# Patient Record
Sex: Female | Born: 1985 | Race: White | Hispanic: No | Marital: Single
Health system: Midwestern US, Community
[De-identification: ages and names within clinical notes are randomized; demographics above are authoritative.]

## PROBLEM LIST (undated history)

## (undated) DIAGNOSIS — K219 Gastro-esophageal reflux disease without esophagitis: Secondary | ICD-10-CM

## (undated) DIAGNOSIS — R87629 Unspecified abnormal cytological findings in specimens from vagina: Secondary | ICD-10-CM

## (undated) DIAGNOSIS — F909 Attention-deficit hyperactivity disorder, unspecified type: Secondary | ICD-10-CM

## (undated) HISTORY — PX: CHOLECYSTECTOMY: SHX55

## (undated) HISTORY — DX: Unspecified abnormal cytological findings in specimens from vagina: R87.629

## (undated) HISTORY — PX: GALLBLADDER SURGERY: SHX652

---
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
## 2010-09-25 ENCOUNTER — Other Ambulatory Visit: Payer: Self-pay | Admitting: Family Medicine

## 2010-09-25 DIAGNOSIS — M545 Low back pain, unspecified: Secondary | ICD-10-CM

## 2010-09-30 ENCOUNTER — Ambulatory Visit
Admission: RE | Admit: 2010-09-30 | Discharge: 2010-09-30 | Disposition: A | Discharge status: Home / Self Care | Payer: 59 | Source: Ambulatory Visit | Attending: Family Medicine | Admitting: Family Medicine

## 2010-09-30 DIAGNOSIS — M545 Low back pain, unspecified: Secondary | ICD-10-CM

## 2012-01-08 ENCOUNTER — Emergency Department (HOSPITAL_COMMUNITY): Payer: 59

## 2012-01-08 ENCOUNTER — Emergency Department (HOSPITAL_COMMUNITY)
Admission: EM | Admit: 2012-01-08 | Discharge: 2012-01-08 | Disposition: A | Discharge status: Home / Self Care | Payer: 59 | Attending: Emergency Medicine | Admitting: Emergency Medicine

## 2012-01-08 ENCOUNTER — Encounter (HOSPITAL_COMMUNITY): Payer: Self-pay | Admitting: *Deleted

## 2012-01-08 DIAGNOSIS — Z8719 Personal history of other diseases of the digestive system: Secondary | ICD-10-CM | POA: Insufficient documentation

## 2012-01-08 DIAGNOSIS — Z7982 Long term (current) use of aspirin: Secondary | ICD-10-CM | POA: Insufficient documentation

## 2012-01-08 DIAGNOSIS — F17200 Nicotine dependence, unspecified, uncomplicated: Secondary | ICD-10-CM | POA: Insufficient documentation

## 2012-01-08 DIAGNOSIS — R0789 Other chest pain: Secondary | ICD-10-CM | POA: Insufficient documentation

## 2012-01-08 DIAGNOSIS — R079 Chest pain, unspecified: Secondary | ICD-10-CM

## 2012-01-08 DIAGNOSIS — R0602 Shortness of breath: Secondary | ICD-10-CM | POA: Insufficient documentation

## 2012-01-08 HISTORY — DX: Gastro-esophageal reflux disease without esophagitis: K21.9

## 2012-01-08 LAB — POCT PREGNANCY, URINE: Preg Test, Ur: NEGATIVE

## 2012-01-08 MED ORDER — ALPRAZOLAM 0.25 MG PO TABS: 0.2500 mg | ORAL_TABLET | Freq: Three times a day (TID) | ORAL | Status: DC | PRN

## 2012-01-08 NOTE — ED Notes (Signed)
Per ems report, pt was awaken about an hour ago with left chest wall pain, now across epigastric area, under bilateral breast, and chest. Pain was 10/10, 12 lead unremarkable.

## 2012-01-08 NOTE — ED Notes (Signed)
[id number]<AV> Expected date:<BR> Expected time:<BR> Means of arrival:<BR> Comments:<BR> EMS

## 2012-01-08 NOTE — ED Provider Notes (Signed)
History     CSN: [id number]  Arrival date & time 01/08/12  2109   First MD Initiated Contact with Patient 01/08/12 2151      Chief Complaint  Patient presents with  . Chest Pain    (Consider location/radiation/quality/duration/timing/severity/associated sxs/prior treatment) HPI Comments: Erica Stafford awoke reporting severe central and left sided chest pain.  She states it felt vice-like.  She states she also had upper abdominal discomfort.  She denies any active pain. She denies fever, cough, SOB, back pain, recent travel, leg pain/swelling, or hormone/OCP usage.  Patient is a 26 y.o. female presenting with chest pain. The history is provided by the patient. No language interpreter was used.  Chest Pain The chest pain began 1 - 2 hours ago. Duration of episode(s) is 1 hour. Chest pain occurs constantly. The chest pain is resolved. At its most intense, the pain is at 10/10. The pain is currently at 0/10. The severity of the pain is severe. The quality of the pain is described as sharp, pressure-like and tightness. The pain does not radiate. Primary symptoms include shortness of breath. Pertinent negatives for primary symptoms include no fever, no fatigue, no syncope, no cough, no wheezing, no palpitations, no abdominal pain, no vomiting, no dizziness and no altered mental status.  Pertinent negatives for associated symptoms include no claudication, no diaphoresis, no lower extremity edema, no near-syncope, no numbness, no orthopnea, no paroxysmal nocturnal dyspnea and no weakness. She tried nothing for the symptoms. Risk factors include smoking/tobacco exposure.  Her family medical history is significant for heart disease in family (none in folks under the age of 79).     Past Medical History  Diagnosis Date  . GERD (gastroesophageal reflux disease)     History reviewed. No pertinent past surgical history.  No family history on file.  History  Substance Use Topics  . Smoking status:  Current Every Day Smoker  . Smokeless tobacco: Not on file  . Alcohol Use: Yes    OB History    Grav Para Term Preterm Abortions TAB SAB Ect Mult Living                  Review of Systems  Constitutional: Negative for fever, diaphoresis and fatigue.  Respiratory: Positive for shortness of breath. Negative for cough and wheezing.   Cardiovascular: Positive for chest pain. Negative for palpitations, orthopnea, claudication, syncope and near-syncope.  Gastrointestinal: Negative for vomiting and abdominal pain.  Neurological: Negative for dizziness, weakness and numbness.  Psychiatric/Behavioral: Negative for altered mental status.  All other systems reviewed and are negative.    Allergies  Review of patient's allergies indicates no known allergies.  Home Medications   Current Outpatient Rx  Name  Route  Sig  Dispense  Refill  . ASPIRIN 325 MG PO TABS   Oral   Take 325 mg by mouth once.           BP 115/58  Pulse 90  Temp 97.9 F (36.6 C)  Resp 18  SpO2 98%  LMP 01/06/2012  Physical Exam  Nursing note and vitals reviewed. Constitutional: She is oriented to person, place, and time. She appears well-developed and well-nourished. No distress.  HENT:  Head: Normocephalic and atraumatic.  Right Ear: External ear normal.  Left Ear: External ear normal.  Nose: Nose normal.  Mouth/Throat: Oropharynx is clear and moist. No oropharyngeal exudate.  Eyes: Conjunctivae normal are normal. Pupils are equal, round, and reactive to light. Right eye exhibits no discharge. Left  eye exhibits no discharge. No scleral icterus.  Neck: Normal range of motion. Neck supple. No JVD present. No tracheal deviation present.  Cardiovascular: Normal rate, regular rhythm, normal heart sounds and intact distal pulses.  Exam reveals no gallop and no friction rub.   No murmur heard. Pulmonary/Chest: Effort normal and breath sounds normal. No stridor. No respiratory distress. She has no wheezes. She  has no rales. She exhibits no tenderness.  Abdominal: Soft. Bowel sounds are normal. She exhibits no distension and no mass. There is no tenderness. There is no rebound and no guarding.  Musculoskeletal: Normal range of motion. She exhibits no edema and no tenderness.  Lymphadenopathy:    She has no cervical adenopathy.  Neurological: She is alert and oriented to person, place, and time.  Skin: Skin is warm. No rash noted. She is not diaphoretic. No erythema. No pallor.  Psychiatric: She has a normal mood and affect. Her behavior is normal.    ED Course  Procedures (including critical care time)  Labs Reviewed - No data to display Dg Chest 2 View  01/08/2012  *RADIOLOGY REPORT*  Clinical Data: Left anterior chest pain, radiating to the back. History of smoking.  CHEST - 2 VIEW  Comparison: Chest radiograph performed 02/19/2010  Findings: The lungs are relatively well-aerated.  Mildly increased interstitial markings are likely chronic in nature.  There is no evidence of focal opacification, pleural effusion or pneumothorax.  The heart is normal in size; the mediastinal contour is within normal limits.  No acute osseous abnormalities are seen.  IMPRESSION: No acute cardiopulmonary process seen; mildly increased interstitial markings are likely chronic in nature.   Original Report Authenticated By: Tonia Ghent, M.D.      No diagnosis found.   Date: 01/08/2012  Rate: 73 bpm  Rhythm: normal sinus rhythm  QRS Axis: normal  Intervals: normal  ST/T Wave abnormalities: normal  Conduction Disutrbances:none  Narrative Interpretation:   Old EKG Reviewed: none available      MDM  Pt presents for evaluation of chest pain.  She appears nontoxic, note stable VS, NAD.  Pt  Is currently pain free.  She has no respiratory insufficiency, tachycardia, tachypnea, hypoxemia, or hypotension.  Her EKG demonstrates no evidence of strain or acute ischemia.  She is a smoker but denies other risk factors  for early CAD and is low risk for PE by Well's Criteria.  The CXR does not demonstrate cardiomegaly, a ptx, effusion, or infiltrate (note nl mediastinum).  She has been evaluated for CP previously by a PMD and referred for an echocardiogram.  It was thought at the time that she had some issues with anxiety/panic disorder.  She has also been evaluated by a different provider and prescribed a ppi secondary to reflux.  She denies s/s of reflux now.  Pt appears stable, plan discharge home.  Will refer to a local cardiologist for completion of an evaluation for recurrent chest discomfort.  Will also prescribe a trial of xanax.        Tobin Chad, MD 01/08/12 2219

## 2014-12-18 NOTE — ED Provider Notes (Signed)
PATIENT:          Denise Acosta, Denise Acosta          DOS:           12/18/2014  MR #:             [id number]             ACCOUNT #:     [id number]  DATE OF BIRTH:    12-Sep-1985              AGE:           29      HISTORY OF PRESENT ILLNESS:    PERTINENT HISTORY OF PRESENT  ILLNESS. Patient presents today with  burning in  her stomach when eating since yesterday.  The burning has become  constant  today.  She has had reflux before but it is never lasted this long.  She denies  any vomiting but does feel slightly nauseated.  She states that her  reflux seem  to be getting much better over the past one year since she started  exercising,  losing weight and stop smoking.  This is the first episode of reflux  like  symptoms that she has had since then.  She has no history of prior  abdominal  surgeries.        PHYSICAL EXAM Afebrile, hemodynamically stable.  Moist mucosal  membranes.  Neck supple no JVD.  Heart regular,no murmur.  Lungs clear.  Abdomen  soft and  nontender, no rebound or guarding.  Extremities without edema, intact  distal  pulses all extremities.  Alert and oriented x3.  Neuro exam without  lateralizing signs.    MEDICAL DECISION MAKING:    SIGNIFICANT FINDINGS/ED COURSE/MEDICAL DECISION MAKING/TREATMENT  PLAN At this  point time the patient had no reproducible abdominal tenderness on  examination.   I did give her a GI cocktail and Pepcid.  This did not significantly  help  therefore I perform labs including lipase and hepatic panel.  Patient  did have  slight elevations of her frailty and AST.  She denies any alcohol or  acetaminophen usage.  No prior history of hepatitis.  Again she  remains  nontender on serial abdominal examinations and now the medicine does  seem to be  helping somewhat.  She has no obvious signs of cholecystitis  clinically.  Ultrasound services are currently not available.  She is comfortable  going home  and I will put her on Pepcid but she understands to return should  she  develop  worsening pain fevers or vomiting.    PROBLEM LIST:         ED Diagnosis:     Acute epigastric pain (R10.13): Entered Date: 18-Dec-2014 19:43,  Entered By:  Jackquline BerlinNDREJKA, Lyndsie Wallman, Status: Active, ICD-10: R10.13       Admit Reason:     Nausea: Entered Date: 18-Dec-2014 17:57, Entered By: Standley BrookingINTERFACES,  INTERFACES,  Status: Active    Electronic Signatures:  Jackquline BerlinNDREJKA, [place] Searles (DO)  (Signed 18-Dec-2014 19:43)   Authored: HISTORY OF PRESENT ILLNESS, PHYSICAL EXAM, MEDICAL DECISION  MAKING,  PROBLEM LIST      Last Updated: 18-Dec-2014 19:43 by Jackquline BerlinNDREJKA, Servando Kyllonen (DO)            Please see T-Sheet, initial assessment, and physician orders for  further details.    Dictating Physician: Leona SingletonJason E Silvie Obremski, DO  Original Electronic Signature Date: 12/18/2014 06:20 P  JEO  Document #: [id number]    cc:  Coralie Common, MD       [place]       [place] [id number]

## 2014-12-19 LAB — HEPATIC FUNCTION PANEL
ALT: 123 U/L — ABNORMAL HIGH (ref 12–78)
AST: 172 U/L — ABNORMAL HIGH (ref 15–37)
Albumin,Serum: 3.8 g/dL (ref 3.4–5.0)
Alkaline Phosphatase: 70 U/L (ref 45–117)
Bilirubin, Direct: 0.2 mg/dL (ref 0.0–0.2)
Total Bilirubin: 0.5 mg/dL (ref 0.2–1.0)
Total Protein: 7.4 g/dL (ref 6.4–8.2)

## 2014-12-19 LAB — LIPASE: Lipase: 108 IU/L (ref 73–393)

## 2015-03-12 DIAGNOSIS — L309 Dermatitis, unspecified: Secondary | ICD-10-CM | POA: Insufficient documentation

## 2015-10-17 DIAGNOSIS — M7582 Other shoulder lesions, left shoulder: Secondary | ICD-10-CM | POA: Insufficient documentation

## 2015-11-06 ENCOUNTER — Other Ambulatory Visit: Payer: Self-pay | Admitting: Surgery

## 2015-11-06 DIAGNOSIS — S46912D Strain of unspecified muscle, fascia and tendon at shoulder and upper arm level, left arm, subsequent encounter: Secondary | ICD-10-CM

## 2015-11-06 DIAGNOSIS — M7582 Other shoulder lesions, left shoulder: Secondary | ICD-10-CM

## 2017-07-08 ENCOUNTER — Ambulatory Visit
Admission: RE | Admit: 2017-07-08 | Discharge: 2017-07-08 | Disposition: A | Discharge status: Home / Self Care | Payer: No Typology Code available for payment source | Source: Ambulatory Visit | Attending: Nurse Practitioner | Admitting: Nurse Practitioner

## 2017-07-08 ENCOUNTER — Other Ambulatory Visit: Payer: Self-pay | Admitting: Nurse Practitioner

## 2017-07-08 ENCOUNTER — Ambulatory Visit
Admission: RE | Admit: 2017-07-08 | Discharge: 2017-07-08 | Disposition: A | Discharge status: Home / Self Care | Payer: Self-pay | Source: Ambulatory Visit | Attending: Nurse Practitioner | Admitting: Nurse Practitioner

## 2017-07-08 DIAGNOSIS — T148XXA Other injury of unspecified body region, initial encounter: Secondary | ICD-10-CM

## 2019-01-17 ENCOUNTER — Ambulatory Visit (INDEPENDENT_AMBULATORY_CARE_PROVIDER_SITE_OTHER): Payer: 59 | Admitting: Advanced Practice Midwife

## 2019-01-17 ENCOUNTER — Encounter: Payer: Self-pay | Admitting: Advanced Practice Midwife

## 2019-01-17 ENCOUNTER — Other Ambulatory Visit: Payer: Self-pay

## 2019-01-17 VITALS — BP 134/73 | HR 58 | Ht 62.0 in | Wt 165.0 lb

## 2019-01-17 DIAGNOSIS — Z0000 Encounter for general adult medical examination without abnormal findings: Secondary | ICD-10-CM | POA: Insufficient documentation

## 2019-01-17 DIAGNOSIS — Z01419 Encounter for gynecological examination (general) (routine) without abnormal findings: Secondary | ICD-10-CM | POA: Diagnosis not present

## 2019-01-17 DIAGNOSIS — Z124 Encounter for screening for malignant neoplasm of cervix: Secondary | ICD-10-CM

## 2019-01-17 DIAGNOSIS — Z1151 Encounter for screening for human papillomavirus (HPV): Secondary | ICD-10-CM

## 2019-01-17 DIAGNOSIS — Z87891 Personal history of nicotine dependence: Secondary | ICD-10-CM | POA: Diagnosis not present

## 2019-01-17 NOTE — Patient Instructions (Signed)
Preventive Care 21-32 Years Old, Female Preventive care refers to visits with your health care provider and lifestyle choices that can promote health and wellness. This includes:  A yearly physical exam. This may also be called an annual well check.  Regular dental visits and eye exams.  Immunizations.  Screening for certain conditions.  Healthy lifestyle choices, such as eating a healthy diet, getting regular exercise, not using drugs or products that contain nicotine and tobacco, and limiting alcohol use. What can I expect for my preventive care visit? Physical exam Your health care provider will check your:  Height and weight. This may be used to calculate body mass index (BMI), which tells if you are at a healthy weight.  Heart rate and blood pressure.  Skin for abnormal spots. Counseling Your health care provider may ask you questions about your:  Alcohol, tobacco, and drug use.  Emotional well-being.  Home and relationship well-being.  Sexual activity.  Eating habits.  Work and work environment.  Method of birth control.  Menstrual cycle.  Pregnancy history. What immunizations do I need?  Influenza (flu) vaccine  This is recommended every year. Tetanus, diphtheria, and pertussis (Tdap) vaccine  You may need a Td booster every 10 years. Varicella (chickenpox) vaccine  You may need this if you have not been vaccinated. Human papillomavirus (HPV) vaccine  If recommended by your health care provider, you may need three doses over 6 months. Measles, mumps, and rubella (MMR) vaccine  You may need at least one dose of MMR. You may also need a second dose. Meningococcal conjugate (MenACWY) vaccine  One dose is recommended if you are age 19-21 years and a first-year college student living in a residence hall, or if you have one of several medical conditions. You may also need additional booster doses. Pneumococcal conjugate (PCV13) vaccine  You may need  this if you have certain conditions and were not previously vaccinated. Pneumococcal polysaccharide (PPSV23) vaccine  You may need one or two doses if you smoke cigarettes or if you have certain conditions. Hepatitis A vaccine  You may need this if you have certain conditions or if you travel or work in places where you may be exposed to hepatitis A. Hepatitis B vaccine  You may need this if you have certain conditions or if you travel or work in places where you may be exposed to hepatitis B. Haemophilus influenzae type b (Hib) vaccine  You may need this if you have certain conditions. You may receive vaccines as individual doses or as more than one vaccine together in one shot (combination vaccines). Talk with your health care provider about the risks and benefits of combination vaccines. What tests do I need?  Blood tests  Lipid and cholesterol levels. These may be checked every 5 years starting at age 20.  Hepatitis C test.  Hepatitis B test. Screening  Diabetes screening. This is done by checking your blood sugar (glucose) after you have not eaten for a while (fasting).  Sexually transmitted disease (STD) testing.  BRCA-related cancer screening. This may be done if you have a family history of breast, ovarian, tubal, or peritoneal cancers.  Pelvic exam and Pap test. This may be done every 3 years starting at age 21. Starting at age 30, this may be done every 5 years if you have a Pap test in combination with an HPV test. Talk with your health care provider about your test results, treatment options, and if necessary, the need for more tests.   Follow these instructions at home: Eating and drinking   Eat a diet that includes fresh fruits and vegetables, whole grains, lean protein, and low-fat dairy.  Take vitamin and mineral supplements as recommended by your health care provider.  Do not drink alcohol if: ? Your health care provider tells you not to drink. ? You are  pregnant, may be pregnant, or are planning to become pregnant.  If you drink alcohol: ? Limit how much you have to 0-1 drink a day. ? Be aware of how much alcohol is in your drink. In the [place] one drink equals one 12 oz bottle of beer (355 mL), one 5 oz glass of wine (148 mL), or one 1 oz glass of hard liquor (44 mL). Lifestyle  Take daily care of your teeth and gums.  Stay active. Exercise for at least 30 minutes on 5 or more days each week.  Do not use any products that contain nicotine or tobacco, such as cigarettes, e-cigarettes, and chewing tobacco. If you need help quitting, ask your health care provider.  If you are sexually active, practice safe sex. Use a condom or other form of birth control (contraception) in order to prevent pregnancy and STIs (sexually transmitted infections). If you plan to become pregnant, see your health care provider for a preconception visit. What's next?  Visit your health care provider once a year for a well check visit.  Ask your health care provider how often you should have your eyes and teeth checked.  Stay up to date on all vaccines. This information is not intended to replace advice given to you by your health care provider. Make sure you discuss any questions you have with your health care provider. Document Revised: 09/08/2017 Document Reviewed: 09/08/2017 Elsevier Patient Education  2020 [employer].

## 2019-01-17 NOTE — Progress Notes (Signed)
GYNECOLOGY ANNUAL PREVENTATIVE CARE ENCOUNTER NOTE  History:     Erica Stafford is a 34 y.o. G0P0000 female here for a routine annual gynecologic exam.  Current complaints: none.   Denies abnormal vaginal bleeding, discharge, pelvic pain, problems with intercourse or other gynecologic concerns.    Patient works as a IT sales professional. Exercises up to 3 times per day. Female partner, penetrative sex. Former cigarette smoker, quit four years ago. Denies SI, HI, IPV.   Gynecologic History Patient's last menstrual period was 01/02/2019 (approximate). Contraception: none Last Pap: No prior paps.  Last mammogram: N/A.   Obstetric History OB History  Gravida Para Term Preterm AB Living  0 0 0 0 0 0  SAB TAB Ectopic Multiple Live Births  0 0 0 0 0    Past Medical History:  Diagnosis Date  . GERD (gastroesophageal reflux disease)     Past Surgical History:  Procedure Laterality Date  . GALLBLADDER SURGERY      Current Outpatient Medications on File Prior to Visit  Medication Sig Dispense Refill  . amphetamine-dextroamphetamine (ADDERALL) 5 MG tablet Take 5 mg by mouth daily.    Marland Kitchen ALPRAZolam (XANAX) 0.25 MG tablet Take 1 tablet (0.25 mg total) by mouth 3 (three) times daily as needed for anxiety. (Patient not taking: Reported on 01/17/2019) 15 tablet 0  . aspirin 325 MG tablet Take 325 mg by mouth once.     No current facility-administered medications on file prior to visit.    No Known Allergies  Social History:  reports that she has quit smoking. She has never used smokeless tobacco. She reports current alcohol use. She reports that she does not use drugs.  Family History  Problem Relation Age of Onset  . Breast cancer Maternal Grandmother     The following portions of the patient's history were reviewed and updated as appropriate: allergies, current medications, past family history, past medical history, past social history, past surgical history and problem list.  Review  of Systems Pertinent items noted in HPI and remainder of comprehensive ROS otherwise negative.  Physical Exam:  BP 134/73   Pulse (!) 58   Ht 5\' 2"  (1.575 m)   Wt 165 lb (74.8 kg)   LMP 01/02/2019 (Approximate)   BMI 30.18 kg/m  CONSTITUTIONAL: Well-developed, well-nourished female in no acute distress.  HENT:  Normocephalic, atraumatic, External right and left ear normal. Oropharynx is clear and moist EYES: Conjunctivae and EOM are normal. Pupils are equal, round, and reactive to light. No scleral icterus.  NECK: Normal range of motion, supple, no masses.  Normal thyroid.  SKIN: Skin is warm and dry. No rash noted. Not diaphoretic. No erythema. No pallor. MUSCULOSKELETAL: Normal range of motion. No tenderness.  No cyanosis, clubbing, or edema.  2+ distal pulses. NEUROLOGIC: Alert and oriented to person, place, and time. Normal reflexes, muscle tone coordination.  PSYCHIATRIC: Normal mood and affect. Normal behavior. Normal judgment and thought content. CARDIOVASCULAR: Normal heart rate noted, regular rhythm RESPIRATORY: Clear to auscultation bilaterally. Effort and breath sounds normal, no problems with respiration noted. BREASTS: Symmetric in size. No masses, tenderness, skin changes, nipple drainage, or lymphadenopathy bilaterally. ABDOMEN: Soft, no distention noted.  No tenderness, rebound or guarding.  PELVIC: Normal appearing external genitalia and urethral meatus; normal appearing vaginal mucosa and cervix.  No abnormal discharge noted.  Pap smear obtained.  Normal uterine size, no other palpable masses, no uterine or adnexal tenderness.   Assessment and Plan:    1. Well  woman exam with routine gynecological exam - No concerning findings on physical exam - Additional labs requested by PCP - PAP over 30 - TSH - Vitamin D (25 hydroxy) - CMP - Lipid panel - Hemoglobin A1c - Hepatitis B surface antigen - Hepatitis C antibody - HIV antibody - RPR - B12 and Folate Panel - CBC  w/Diff  2. Former smoker - Education officer, environmental!  Will follow up results of pap smear and manage accordingly. Routine preventative health maintenance measures emphasized. Please refer to After Visit Summary for other counseling recommendations.      Total visit time 30 minutes. Greater than 50% of visit spent in counseling and coordination of care  Mallie Snooks, MSN, CNM Certified Nurse Midwife, [employer] for [employer], [facility] 01/17/19 1:15 PM

## 2019-01-18 LAB — COMPREHENSIVE METABOLIC PANEL
ALT: 10 IU/L (ref 0–32)
AST: 17 IU/L (ref 0–40)
Albumin/Globulin Ratio: 2 (ref 1.2–2.2)
Albumin: 4.4 g/dL (ref 3.8–4.8)
Alkaline Phosphatase: 26 IU/L — ABNORMAL LOW (ref 39–117)
BUN/Creatinine Ratio: 19 (ref 9–23)
BUN: 14 mg/dL (ref 6–20)
Bilirubin Total: 0.4 mg/dL (ref 0.0–1.2)
CO2: 23 mmol/L (ref 20–29)
Calcium: 9.5 mg/dL (ref 8.7–10.2)
Chloride: 103 mmol/L (ref 96–106)
Creatinine, Ser: 0.75 mg/dL (ref 0.57–1.00)
GFR calc Af Amer: 123 mL/min/{1.73_m2} (ref >59)
GFR calc non Af Amer: 107 mL/min/{1.73_m2} (ref >59)
Globulin, Total: 2.2 g/dL (ref 1.5–4.5)
Glucose: 85 mg/dL (ref 65–99)
Potassium: 4 mmol/L (ref 3.5–5.2)
Sodium: 138 mmol/L (ref 134–144)
Total Protein: 6.6 g/dL (ref 6.0–8.5)

## 2019-01-18 LAB — B12 AND FOLATE PANEL
Folate: 6 ng/mL (ref >3.0)
Vitamin B-12: 490 pg/mL (ref 232–1245)

## 2019-01-18 LAB — CBC WITH DIFFERENTIAL/PLATELET
Basophils Absolute: 0.1 10*3/uL (ref 0.0–0.2)
Basos: 1 %
EOS (ABSOLUTE): 0 10*3/uL (ref 0.0–0.4)
Eos: 1 %
Hematocrit: 39.9 % (ref 34.0–46.6)
Hemoglobin: 13.9 g/dL (ref 11.1–15.9)
Immature Grans (Abs): 0 10*3/uL (ref 0.0–0.1)
Immature Granulocytes: 0 %
Lymphocytes Absolute: 2 10*3/uL (ref 0.7–3.1)
Lymphs: 39 %
MCH: 32.8 pg (ref 26.6–33.0)
MCHC: 34.8 g/dL (ref 31.5–35.7)
MCV: 94 fL (ref 79–97)
Monocytes Absolute: 0.4 10*3/uL (ref 0.1–0.9)
Monocytes: 9 %
Neutrophils Absolute: 2.5 10*3/uL (ref 1.4–7.0)
Neutrophils: 50 %
Platelets: 240 10*3/uL (ref 150–450)
RBC: 4.24 x10E6/uL (ref 3.77–5.28)
RDW: 12 % (ref 11.7–15.4)
WBC: 5 10*3/uL (ref 3.4–10.8)

## 2019-01-18 LAB — LIPID PANEL
Chol/HDL Ratio: 2.9 ratio (ref 0.0–4.4)
Cholesterol, Total: 140 mg/dL (ref 100–199)
HDL: 48 mg/dL (ref >39)
LDL Chol Calc (NIH): 78 mg/dL (ref 0–99)
Triglycerides: 67 mg/dL (ref 0–149)
VLDL Cholesterol Cal: 14 mg/dL (ref 5–40)

## 2019-01-18 LAB — HEPATITIS B SURFACE ANTIGEN: Hepatitis B Surface Ag: NEGATIVE

## 2019-01-18 LAB — VITAMIN D 25 HYDROXY (VIT D DEFICIENCY, FRACTURES): Vit D, 25-Hydroxy: 33.2 ng/mL (ref 30.0–100.0)

## 2019-01-18 LAB — HEMOGLOBIN A1C
Est. average glucose Bld gHb Est-mCnc: 91 mg/dL
Hgb A1c MFr Bld: 4.8 % (ref 4.8–5.6)

## 2019-01-18 LAB — HIV ANTIBODY (ROUTINE TESTING W REFLEX): HIV Screen 4th Generation wRfx: NONREACTIVE

## 2019-01-18 LAB — HEPATITIS C ANTIBODY: Hep C Virus Ab: <0.1 s/co ratio (ref 0.0–0.9)

## 2019-01-18 LAB — TSH: TSH: 2.1 u[IU]/mL (ref 0.450–4.500)

## 2019-01-18 LAB — RPR: RPR Ser Ql: NONREACTIVE

## 2019-01-22 ENCOUNTER — Encounter: Payer: Self-pay | Admitting: Advanced Practice Midwife

## 2019-01-22 ENCOUNTER — Telehealth: Payer: Self-pay | Admitting: Advanced Practice Midwife

## 2019-01-22 DIAGNOSIS — R87619 Unspecified abnormal cytological findings in specimens from cervix uteri: Secondary | ICD-10-CM | POA: Insufficient documentation

## 2019-01-22 LAB — CYTOLOGY - PAP
Comment: NEGATIVE
Comment: NEGATIVE
Comment: NEGATIVE
Diagnosis: UNDETERMINED — AB
HPV 16: NEGATIVE
HPV 18 / 45: NEGATIVE
High risk HPV: POSITIVE — AB

## 2019-01-22 NOTE — Telephone Encounter (Signed)
Patient called to discuss pap results, indication for colpo. Discussed basics of procedure, timeline for performing, confirmed will be performed wt [facility] . Patient denies questions at end of call. Knows she can reach out to me via MyChart for additional questions. Clayton Bibles, MSN, CNM Certified Nurse Midwife, [facility] for [employer], [facility] 01/22/19 4:37 PM

## 2019-02-07 ENCOUNTER — Encounter: Payer: 59 | Admitting: Family Medicine

## 2019-02-11 NOTE — Telephone Encounter (Signed)
Entered in error, duplicate of phone encounter from 01/22/2019

## 2019-02-16 ENCOUNTER — Encounter: Payer: Self-pay | Admitting: Radiology

## 2019-11-21 ENCOUNTER — Encounter: Payer: Self-pay | Admitting: Radiology

## 2020-01-07 ENCOUNTER — Other Ambulatory Visit: Payer: Self-pay

## 2020-01-07 ENCOUNTER — Ambulatory Visit
Admission: EM | Admit: 2020-01-07 | Discharge: 2020-01-07 | Disposition: A | Discharge status: Home / Self Care | Payer: 59 | Attending: Family Medicine | Admitting: Family Medicine

## 2020-01-07 DIAGNOSIS — B349 Viral infection, unspecified: Secondary | ICD-10-CM | POA: Insufficient documentation

## 2020-01-07 HISTORY — DX: Attention-deficit hyperactivity disorder, unspecified type: F90.9

## 2020-01-07 LAB — POCT RAPID STREP A (OFFICE): Rapid Strep A Screen: NEGATIVE

## 2020-01-07 NOTE — ED Provider Notes (Signed)
Renaldo Fiddler    CSN: [id number] Arrival date & time: 01/07/20  8563      History   Chief Complaint Chief Complaint  Patient presents with  . Sore Throat    HPI Erica Stafford is a 34 y.o. female.   Patient is a 34 year old female who presents today with approximate 3 days of sore throat.  99.4 fever at home.  Also has cough.  Took Tylenol this morning.  Has been COVID vaccinated.     Past Medical History:  Diagnosis Date  . ADHD   . GERD (gastroesophageal reflux disease)     Patient Active Problem List   Diagnosis Date Noted  . Abnormal Pap smear of cervix 01/22/2019  . Well woman exam with routine gynecological exam 01/17/2019    Past Surgical History:  Procedure Laterality Date  . CHOLECYSTECTOMY    . GALLBLADDER SURGERY      OB History    Gravida  0   Para  0   Term  0   Preterm  0   AB  0   Living  0     SAB  0   IAB  0   Ectopic  0   Multiple  0   Live Births  0            Home Medications    Prior to Admission medications   Medication Sig Start Date End Date Taking? Authorizing Provider  amphetamine-dextroamphetamine (ADDERALL) 5 MG tablet Take 5 mg by mouth daily.   Yes [provider]  aspirin 325 MG tablet Take 325 mg by mouth once.   Yes [provider]    Family History Family History  Problem Relation Age of Onset  . Breast cancer Maternal Grandmother     Social History Social History   Tobacco Use  . Smoking status: Former Games developer  . Smokeless tobacco: Never Used  Vaping Use  . Vaping Use: Never used  Substance Use Topics  . Alcohol use: Yes  . Drug use: No     Allergies   Patient has no known allergies.   Review of Systems Review of Systems   Physical Exam Triage Vital Signs ED Triage Vitals  Enc Vitals Group     BP 01/07/20 0828 121/73     Pulse Rate 01/07/20 0828 82     Resp 01/07/20 0828 16     Temp 01/07/20 0828 98.5 F (36.9 C)     Temp Source 01/07/20  0828 Oral     SpO2 01/07/20 0828 98 %     Weight --      Height --      Head Circumference --      Peak Flow --      Pain Score 01/07/20 0830 4     Pain Loc --      Pain Edu? --      Excl. in GC? --    No data found.  Updated Vital Signs BP 121/73 (BP Location: Left Arm)   Pulse 82   Temp 98.5 F (36.9 C) (Oral)   Resp 16   LMP 12/12/2019   SpO2 98%   Visual Acuity Right Eye Distance:   Left Eye Distance:   Bilateral Distance:    Right Eye Near:   Left Eye Near:    Bilateral Near:     Physical Exam Constitutional:      General: She is not in acute distress.    Appearance: She is  not ill-appearing, toxic-appearing or diaphoretic.  HENT:     Head: Normocephalic.     Right Ear: Tympanic membrane and ear canal normal.     Left Ear: Tympanic membrane and ear canal normal.     Mouth/Throat:     Pharynx: Uvula midline. Posterior oropharyngeal erythema present.     Tonsils: 0 on the right. 0 on the left.  Eyes:     Conjunctiva/sclera: Conjunctivae normal.  Cardiovascular:     Heart sounds: Normal heart sounds.  Pulmonary:     Effort: Pulmonary effort is normal.     Breath sounds: Normal breath sounds.  Lymphadenopathy:     Cervical: Cervical adenopathy present.  Skin:    General: Skin is warm and dry.  Neurological:     Mental Status: She is alert.      UC Treatments / Results  Labs (all labs ordered are listed, but only abnormal results are displayed) Labs Reviewed  NOVEL CORONAVIRUS, NAA  POCT RAPID STREP A (OFFICE)    EKG   Radiology No results found.  Procedures Procedures (including critical care time)  Medications Ordered in UC Medications - No data to display  Initial Impression / Assessment and Plan / UC Course  I have reviewed the triage vital signs and the nursing notes.  Pertinent labs & imaging results that were available during my care of the patient were reviewed by me and considered in my medical decision making (see chart for  details).     Viral illness Strep test negative. Culture pending.  OTC meds as needed.  covid test pending.  Follow up as needed for continued or worsening symptoms  Final Clinical Impressions(s) / UC Diagnoses   Final diagnoses:  Viral illness     Discharge Instructions     Viral illness. Strep negative. We will send the strep for culture. You may use OTC chloraseptic spray or throat lozenges for relief. You can also try some zyrtec for your symptoms because this may ne allergy related. Follow up if not better or worsening symptoms      ED Prescriptions    None     PDMP not reviewed this encounter.   Janace Aris, NP 01/07/20 [phone]

## 2020-01-07 NOTE — Discharge Instructions (Signed)
Viral illness. Strep negative. We will send the strep for culture. You may use OTC chloraseptic spray or throat lozenges for relief. You can also try some zyrtec for your symptoms because this may ne allergy related. Follow up if not better or worsening symptoms

## 2020-01-07 NOTE — ED Triage Notes (Signed)
Pt presents with sore throat x 3 days.  Temp 99.4 at home.  + cough.  Took Tylenol 0330 this morning.   Has had COVID x 2.

## 2020-01-09 LAB — NOVEL CORONAVIRUS, NAA: SARS-CoV-2, NAA: DETECTED — AB

## 2020-01-09 LAB — SARS-COV-2, NAA 2 DAY TAT

## 2020-01-10 LAB — CULTURE, GROUP A STREP (THRC)

## 2020-03-14 DIAGNOSIS — M9904 Segmental and somatic dysfunction of sacral region: Secondary | ICD-10-CM | POA: Diagnosis not present

## 2020-03-14 DIAGNOSIS — M9903 Segmental and somatic dysfunction of lumbar region: Secondary | ICD-10-CM | POA: Diagnosis not present

## 2020-03-14 DIAGNOSIS — M9902 Segmental and somatic dysfunction of thoracic region: Secondary | ICD-10-CM | POA: Diagnosis not present

## 2020-03-14 DIAGNOSIS — M5386 Other specified dorsopathies, lumbar region: Secondary | ICD-10-CM | POA: Diagnosis not present

## 2020-03-26 ENCOUNTER — Other Ambulatory Visit: Payer: Self-pay

## 2020-03-26 ENCOUNTER — Ambulatory Visit (INDEPENDENT_AMBULATORY_CARE_PROVIDER_SITE_OTHER): Payer: BC Managed Care – PPO | Admitting: Advanced Practice Midwife

## 2020-03-26 ENCOUNTER — Other Ambulatory Visit (HOSPITAL_COMMUNITY)
Admission: RE | Admit: 2020-03-26 | Discharge: 2020-03-26 | Disposition: A | Discharge status: Home / Self Care | Payer: BC Managed Care – PPO | Source: Ambulatory Visit | Attending: Advanced Practice Midwife | Admitting: Advanced Practice Midwife

## 2020-03-26 ENCOUNTER — Encounter: Payer: Self-pay | Admitting: Advanced Practice Midwife

## 2020-03-26 VITALS — BP 128/71 | HR 77 | Ht 62.0 in | Wt 163.6 lb

## 2020-03-26 DIAGNOSIS — Z8742 Personal history of other diseases of the female genital tract: Secondary | ICD-10-CM | POA: Insufficient documentation

## 2020-03-26 DIAGNOSIS — Z01419 Encounter for gynecological examination (general) (routine) without abnormal findings: Secondary | ICD-10-CM

## 2020-03-26 DIAGNOSIS — R87610 Atypical squamous cells of undetermined significance on cytologic smear of cervix (ASC-US): Secondary | ICD-10-CM | POA: Diagnosis not present

## 2020-03-26 DIAGNOSIS — Z9889 Other specified postprocedural states: Secondary | ICD-10-CM | POA: Insufficient documentation

## 2020-03-26 DIAGNOSIS — Z1371 Encounter for nonprocreative screening for genetic disease carrier status: Secondary | ICD-10-CM | POA: Diagnosis not present

## 2020-03-26 NOTE — Progress Notes (Signed)
GYNECOLOGY ANNUAL PREVENTATIVE CARE ENCOUNTER NOTE  History:     Erica Stafford is a 35 y.o. G0P0000 female here for a routine annual gynecologic exam.  Current complaints: none.   Denies abnormal vaginal bleeding, discharge, pelvic pain, problems with intercourse or other gynecologic concerns.    Patient works as a IT sales professional. She and her wife Erica Stafford are TTC by the end of 2022. Former smoker.  Takes Adderall, also tumeric, ginger and Collagen.    Gynecologic History No LMP recorded. Contraception: none Last Pap: 01/17/2019 . Results were: ASCUS with positive HRHPV Last mammogram: N/A. Results were: normal  Obstetric History OB History  Gravida Para Term Preterm AB Living  0 0 0 0 0 0  SAB IAB Ectopic Multiple Live Births  0 0 0 0 0    Past Medical History:  Diagnosis Date  . ADHD   . GERD (gastroesophageal reflux disease)   . Vaginal Pap smear, abnormal     Past Surgical History:  Procedure Laterality Date  . CHOLECYSTECTOMY    . GALLBLADDER SURGERY      Current Outpatient Medications on File Prior to Visit  Medication Sig Dispense Refill  . amphetamine-dextroamphetamine (ADDERALL) 5 MG tablet Take 5 mg by mouth daily.    Marland Kitchen aspirin 325 MG tablet Take 325 mg by mouth once.     No current facility-administered medications on file prior to visit.    No Known Allergies  Social History:  reports that she has quit smoking. She has never used smokeless tobacco. She reports current alcohol use. She reports that she does not use drugs.  Family History  Problem Relation Age of Onset  . Breast cancer Maternal Grandmother     The following portions of the patient's history were reviewed and updated as appropriate: allergies, current medications, past family history, past medical history, past social history, past surgical history and problem list.  Review of Systems Pertinent items noted in HPI and remainder of comprehensive ROS otherwise negative.  Physical Exam:   BP 128/71   Pulse 77   Ht 5\' 2"  (1.575 m)   Wt 163 lb 9.6 oz (74.2 kg)   BMI 29.92 kg/m  CONSTITUTIONAL: Well-developed, well-nourished female in no acute distress.  HENT:  Normocephalic, atraumatic, External right and left ear normal.  EYES: Conjunctivae and EOM are normal. Pupils are equal, round, and reactive to light. No scleral icterus.  NECK: Normal range of motion, supple, no masses.  Normal thyroid.  SKIN: Skin is warm and dry. No rash noted. Not diaphoretic. No erythema. No pallor. MUSCULOSKELETAL: Normal range of motion. No tenderness.  No cyanosis, clubbing, or edema. NEUROLOGIC: Alert and oriented to person, place, and time. Normal reflexes, muscle tone coordination.  PSYCHIATRIC: Normal mood and affect. Normal behavior. Normal judgment and thought content. CARDIOVASCULAR: Normal heart rate noted, regular rhythm RESPIRATORY: Clear to auscultation bilaterally. Effort and breath sounds normal, no problems with respiration noted. BREASTS: Symmetric in size. No masses, tenderness, skin changes, nipple drainage, or lymphadenopathy bilaterally. Performed in the presence of a chaperone. ABDOMEN: Soft, no distention noted.  No tenderness, rebound or guarding.  PELVIC: Normal appearing external genitalia and urethral meatus; normal appearing vaginal mucosa and cervix.  No abnormal discharge noted.  Pap smear obtained.  Normal uterine size, no other palpable masses, no uterine or adnexal tenderness.  Performed in the presence of a chaperone.   Assessment and Plan:    1. Well woman exam with routine gynecological exam - No abnormal findings  today - Desires Copywriter, advertising genetic screening. Collected today  2. History of colposcopy - Performed at [facility] 01/26/2019 - Report visible in media tab: LGSIL - Cytology - PAP  3. Hx of abnormal cervical Pap smear - ASCUS + + HRHPV on first pap, collected last year - Pt verbalizes preference for annual pap  Will follow up results of  pap smear and manage accordingly. Routine preventative health maintenance measures emphasized. Please refer to After Visit Summary for other counseling recommendations.      Clayton Bibles, MSA, MSN, CNM Certified Nurse Midwife, Biochemist, clinical for [employer], [facility]

## 2020-03-26 NOTE — Patient Instructions (Signed)
Preventive Care 21-33 Years Old, Female Preventive care refers to lifestyle choices and visits with your health care provider that can promote health and wellness. This includes:  A yearly physical exam. This is also called an annual wellness visit.  Regular dental and eye exams.  Immunizations.  Screening for certain conditions.  Healthy lifestyle choices, such as: ? Eating a healthy diet. ? Getting regular exercise. ? Not using drugs or products that contain nicotine and tobacco. ? Limiting alcohol use. What can I expect for my preventive care visit? Physical exam Your health care provider may check your:  Height and weight. These may be used to calculate your BMI (body mass index). BMI is a measurement that tells if you are at a healthy weight.  Heart rate and blood pressure.  Body temperature.  Skin for abnormal spots. Counseling Your health care provider may ask you questions about your:  Past medical problems.  Family's medical history.  Alcohol, tobacco, and drug use.  Emotional well-being.  Home life and relationship well-being.  Sexual activity.  Diet, exercise, and sleep habits.  Work and work environment.  Access to firearms.  Method of birth control.  Menstrual cycle.  Pregnancy history. What immunizations do I need? Vaccines are usually given at various ages, according to a schedule. Your health care provider will recommend vaccines for you based on your age, medical history, and lifestyle or other factors, such as travel or where you work.   What tests do I need? Blood tests  Lipid and cholesterol levels. These may be checked every 5 years starting at age 20.  Hepatitis C test.  Hepatitis B test. Screening  Diabetes screening. This is done by checking your blood sugar (glucose) after you have not eaten for a while (fasting).  STD (sexually transmitted disease) testing, if you are at risk.  BRCA-related cancer screening. This may be  done if you have a family history of breast, ovarian, tubal, or peritoneal cancers.  Pelvic exam and Pap test. This may be done every 3 years starting at age 21. Starting at age 30, this may be done every 5 years if you have a Pap test in combination with an HPV test. Talk with your health care provider about your test results, treatment options, and if necessary, the need for more tests.   Follow these instructions at home: Eating and drinking  Eat a healthy diet that includes fresh fruits and vegetables, whole grains, lean protein, and low-fat dairy products.  Take vitamin and mineral supplements as recommended by your health care provider.  Do not drink alcohol if: ? Your health care provider tells you not to drink. ? You are pregnant, may be pregnant, or are planning to become pregnant.  If you drink alcohol: ? Limit how much you have to 0-1 drink a day. ? Be aware of how much alcohol is in your drink. In the [place] one drink equals one 12 oz bottle of beer (355 mL), one 5 oz glass of wine (148 mL), or one 1 oz glass of hard liquor (44 mL).   Lifestyle  Take daily care of your teeth and gums. Brush your teeth every morning and night with fluoride toothpaste. Floss one time each day.  Stay active. Exercise for at least 30 minutes 5 or more days each week.  Do not use any products that contain nicotine or tobacco, such as cigarettes, e-cigarettes, and chewing tobacco. If you need help quitting, ask your health care provider.  Do not   use drugs.  If you are sexually active, practice safe sex. Use a condom or other form of protection to prevent STIs (sexually transmitted infections).  If you do not wish to become pregnant, use a form of birth control. If you plan to become pregnant, see your health care provider for a prepregnancy visit.  Find healthy ways to cope with stress, such as: ? Meditation, yoga, or listening to music. ? Journaling. ? Talking to a trusted  person. ? Spending time with friends and family. Safety  Always wear your seat belt while driving or riding in a vehicle.  Do not drive: ? If you have been drinking alcohol. Do not ride with someone who has been drinking. ? When you are tired or distracted. ? While texting.  Wear a helmet and other protective equipment during sports activities.  If you have firearms in your house, make sure you follow all gun safety procedures.  Seek help if you have been physically or sexually abused. What's next?  Go to your health care provider once a year for an annual wellness visit.  Ask your health care provider how often you should have your eyes and teeth checked.  Stay up to date on all vaccines. This information is not intended to replace advice given to you by your health care provider. Make sure you discuss any questions you have with your health care provider. Document Revised: 08/26/2019 Document Reviewed: 09/08/2017 Elsevier Patient Education  2021 Elsevier Inc.  

## 2020-03-28 LAB — CYTOLOGY - PAP
Comment: NEGATIVE
Diagnosis: UNDETERMINED — AB
High risk HPV: NEGATIVE

## 2020-04-07 ENCOUNTER — Encounter: Payer: Self-pay | Admitting: Radiology

## 2020-04-08 DIAGNOSIS — Z20822 Contact with and (suspected) exposure to covid-19: Secondary | ICD-10-CM | POA: Diagnosis not present

## 2020-04-10 DIAGNOSIS — M9904 Segmental and somatic dysfunction of sacral region: Secondary | ICD-10-CM | POA: Diagnosis not present

## 2020-04-10 DIAGNOSIS — M9902 Segmental and somatic dysfunction of thoracic region: Secondary | ICD-10-CM | POA: Diagnosis not present

## 2020-04-10 DIAGNOSIS — M5386 Other specified dorsopathies, lumbar region: Secondary | ICD-10-CM | POA: Diagnosis not present

## 2020-04-10 DIAGNOSIS — M9903 Segmental and somatic dysfunction of lumbar region: Secondary | ICD-10-CM | POA: Diagnosis not present

## 2020-04-23 DIAGNOSIS — M2242 Chondromalacia patellae, left knee: Secondary | ICD-10-CM | POA: Diagnosis not present

## 2020-04-23 DIAGNOSIS — M170 Bilateral primary osteoarthritis of knee: Secondary | ICD-10-CM | POA: Diagnosis not present

## 2020-04-23 DIAGNOSIS — M2241 Chondromalacia patellae, right knee: Secondary | ICD-10-CM | POA: Diagnosis not present

## 2020-04-23 DIAGNOSIS — M25562 Pain in left knee: Secondary | ICD-10-CM | POA: Insufficient documentation

## 2020-05-13 ENCOUNTER — Encounter: Payer: Self-pay | Admitting: Obstetrics and Gynecology

## 2020-05-13 ENCOUNTER — Ambulatory Visit (INDEPENDENT_AMBULATORY_CARE_PROVIDER_SITE_OTHER): Payer: BC Managed Care – PPO | Admitting: Obstetrics and Gynecology

## 2020-05-13 ENCOUNTER — Other Ambulatory Visit: Payer: Self-pay

## 2020-05-13 VITALS — BP 128/67 | HR 79 | Wt 167.4 lb

## 2020-05-13 DIAGNOSIS — F902 Attention-deficit hyperactivity disorder, combined type: Secondary | ICD-10-CM | POA: Diagnosis not present

## 2020-05-13 DIAGNOSIS — F4310 Post-traumatic stress disorder, unspecified: Secondary | ICD-10-CM | POA: Diagnosis not present

## 2020-05-13 DIAGNOSIS — Z0000 Encounter for general adult medical examination without abnormal findings: Secondary | ICD-10-CM

## 2020-05-13 NOTE — Progress Notes (Signed)
36 yo presenting for the evaluation of a right breast mass. Patient reports noticing a few months ago. She recently had a normal breast exam but returns for a second opinion. Patient denies any breast pain or drainage from nipple.   Past Medical History:  Diagnosis Date  . ADHD   . GERD (gastroesophageal reflux disease)   . Vaginal Pap smear, abnormal    Past Surgical History:  Procedure Laterality Date  . CHOLECYSTECTOMY    . GALLBLADDER SURGERY     Family History  Problem Relation Age of Onset  . Breast cancer Maternal Grandmother    Social History   Tobacco Use  . Smoking status: Former Games developer  . Smokeless tobacco: Never Used  Vaping Use  . Vaping Use: Never used  Substance Use Topics  . Alcohol use: Yes  . Drug use: No   ROS See pertinent in HPI. All other systems reviewed and non contributory  Blood pressure 128/67, pulse 79, weight 167 lb 6.4 oz (75.9 kg). GENERAL: Well-developed, well-nourished female in no acute distress.  BREASTS: Symmetric in size. No palpable masses or lymphadenopathy, skin changes, or nipple drainage. Normal fibro-cystic changes  A/P 35 yo with normal breast exam - reassurance provided - Patient encouraged to continue monthly breast self-exam  - RTC prn

## 2020-05-29 DIAGNOSIS — Z20822 Contact with and (suspected) exposure to covid-19: Secondary | ICD-10-CM | POA: Diagnosis not present

## 2020-05-29 DIAGNOSIS — M170 Bilateral primary osteoarthritis of knee: Secondary | ICD-10-CM | POA: Diagnosis not present

## 2020-06-05 DIAGNOSIS — M170 Bilateral primary osteoarthritis of knee: Secondary | ICD-10-CM | POA: Diagnosis not present

## 2020-06-11 DIAGNOSIS — M170 Bilateral primary osteoarthritis of knee: Secondary | ICD-10-CM | POA: Diagnosis not present

## 2020-07-11 ENCOUNTER — Other Ambulatory Visit: Payer: Self-pay | Admitting: Nurse Practitioner

## 2020-07-11 ENCOUNTER — Ambulatory Visit
Admission: RE | Admit: 2020-07-11 | Discharge: 2020-07-11 | Disposition: A | Discharge status: Home / Self Care | Payer: No Typology Code available for payment source | Source: Ambulatory Visit | Attending: Nurse Practitioner | Admitting: Nurse Practitioner

## 2020-07-11 DIAGNOSIS — Z021 Encounter for pre-employment examination: Secondary | ICD-10-CM

## 2020-08-06 DIAGNOSIS — F902 Attention-deficit hyperactivity disorder, combined type: Secondary | ICD-10-CM | POA: Diagnosis not present

## 2020-10-24 DIAGNOSIS — F4310 Post-traumatic stress disorder, unspecified: Secondary | ICD-10-CM | POA: Diagnosis not present

## 2020-10-24 DIAGNOSIS — F902 Attention-deficit hyperactivity disorder, combined type: Secondary | ICD-10-CM | POA: Diagnosis not present

## 2020-10-28 DIAGNOSIS — D485 Neoplasm of uncertain behavior of skin: Secondary | ICD-10-CM | POA: Diagnosis not present

## 2020-10-28 DIAGNOSIS — D225 Melanocytic nevi of trunk: Secondary | ICD-10-CM | POA: Diagnosis not present

## 2020-11-24 ENCOUNTER — Ambulatory Visit
Admission: EM | Admit: 2020-11-24 | Discharge: 2020-11-24 | Disposition: A | Discharge status: Home / Self Care | Payer: BC Managed Care – PPO | Attending: Emergency Medicine | Admitting: Emergency Medicine

## 2020-11-24 ENCOUNTER — Other Ambulatory Visit: Payer: Self-pay

## 2020-11-24 ENCOUNTER — Encounter: Payer: Self-pay | Admitting: Emergency Medicine

## 2020-11-24 DIAGNOSIS — J101 Influenza due to other identified influenza virus with other respiratory manifestations: Secondary | ICD-10-CM | POA: Diagnosis not present

## 2020-11-24 LAB — POCT INFLUENZA A/B
Influenza A, POC: POSITIVE — AB
Influenza B, POC: NEGATIVE

## 2020-11-24 MED ORDER — OSELTAMIVIR PHOSPHATE 75 MG PO CAPS: 75.0000 mg | ORAL_CAPSULE | Freq: Two times a day (BID) | ORAL | 0 refills | Status: DC

## 2020-11-24 NOTE — Discharge Instructions (Addendum)
Take the Tamiflu as directed.  Take Tylenol or ibuprofen as needed for fever or discomfort.  Follow-up with your primary care provider if your symptoms are not improving.  

## 2020-11-24 NOTE — ED Provider Notes (Signed)
Erica Stafford    CSN: [id number] Arrival date & time: 11/24/20  1538      History   Chief Complaint Chief Complaint  Patient presents with   Cough   Fever    HPI Erica Stafford is a 35 y.o. female.  Patient presents with 2-day history of fever, chills, body aches, congestion, sore throat, cough.  Treatment at home with TheraFlu; last dose at 6 AM.  She denies rash, shortness of breath, vomiting, diarrhea, or other symptoms.  The history is provided by the patient.   Past Medical History:  Diagnosis Date   ADHD    GERD (gastroesophageal reflux disease)    Vaginal Pap smear, abnormal     Patient Active Problem List   Diagnosis Date Noted   Hx of colposcopy with cervical biopsy 03/26/2020   Abnormal Pap smear of cervix 01/22/2019   Well woman exam with routine gynecological exam 01/17/2019    Past Surgical History:  Procedure Laterality Date   CHOLECYSTECTOMY     GALLBLADDER SURGERY      OB History     Gravida  0   Para  0   Term  0   Preterm  0   AB  0   Living  0      SAB  0   IAB  0   Ectopic  0   Multiple  0   Live Births  0            Home Medications    Prior to Admission medications   Medication Sig Start Date End Date Taking? Authorizing Provider  amphetamine-dextroamphetamine (ADDERALL) 5 MG tablet Take 5 mg by mouth daily.   Yes [provider]  oseltamivir (TAMIFLU) 75 MG capsule Take 1 capsule (75 mg total) by mouth every 12 (twelve) hours. 11/24/20  Yes Mickie Bail, NP  aspirin 325 MG tablet Take 325 mg by mouth once.    [provider]    Family History Family History  Problem Relation Age of Onset   Breast cancer Maternal Grandmother     Social History Social History   Tobacco Use   Smoking status: Former   Smokeless tobacco: Never  Building services engineer Use: Never used  Substance Use Topics   Alcohol use: Yes   Drug use: No     Allergies   Patient has no known  allergies.   Review of Systems Review of Systems  Constitutional:  Positive for chills and fever.  HENT:  Positive for congestion and sore throat. Negative for ear pain.   Respiratory:  Positive for cough. Negative for shortness of breath.   Cardiovascular:  Negative for chest pain and palpitations.  Gastrointestinal:  Negative for diarrhea and vomiting.  Skin:  Negative for color change and rash.  All other systems reviewed and are negative.   Physical Exam Triage Vital Signs ED Triage Vitals  Enc Vitals Group     BP      Pulse      Resp      Temp      Temp src      SpO2      Weight      Height      Head Circumference      Peak Flow      Pain Score      Pain Loc      Pain Edu?      Excl. in GC?    No  data found.  Updated Vital Signs BP 133/80 (BP Location: Left Arm)   Pulse 97   Temp (!) 102 F (38.9 C) (Oral)   Resp 18   LMP 11/22/2020   SpO2 98%   Visual Acuity Right Eye Distance:   Left Eye Distance:   Bilateral Distance:    Right Eye Near:   Left Eye Near:    Bilateral Near:     Physical Exam Vitals and nursing note reviewed.  Constitutional:      General: She is not in acute distress.    Appearance: She is well-developed.  HENT:     Head: Normocephalic and atraumatic.     Right Ear: Tympanic membrane normal.     Left Ear: Tympanic membrane normal.     Nose: Congestion present.     Mouth/Throat:     Mouth: Mucous membranes are moist.     Pharynx: Oropharynx is clear.  Eyes:     Conjunctiva/sclera: Conjunctivae normal.  Cardiovascular:     Rate and Rhythm: Normal rate and regular rhythm.     Heart sounds: Normal heart sounds.  Pulmonary:     Effort: Pulmonary effort is normal. No respiratory distress.     Breath sounds: Normal breath sounds.  Abdominal:     Palpations: Abdomen is soft.     Tenderness: There is no abdominal tenderness.  Musculoskeletal:     Cervical back: Neck supple.  Skin:    General: Skin is warm and dry.   Neurological:     Mental Status: She is alert.  Psychiatric:        Mood and Affect: Mood normal.        Behavior: Behavior normal.     UC Treatments / Results  Labs (all labs ordered are listed, but only abnormal results are displayed) Labs Reviewed  POCT INFLUENZA A/B - Abnormal; Notable for the following components:      Result Value   Influenza A, POC Positive (*)    All other components within normal limits    EKG   Radiology No results found.  Procedures Procedures (including critical care time)  Medications Ordered in UC Medications - No data to display  Initial Impression / Assessment and Plan / UC Course  I have reviewed the triage vital signs and the nursing notes.  Pertinent labs & imaging results that were available during my care of the patient were reviewed by me and considered in my medical decision making (see chart for details).   Influenza A.  Rapid flu test positive for influenza A.  Treating with Tamiflu.  Discussed symptomatic treatment including Tylenol or ibuprofen as needed.  Education provided on influenza.  Instructed patient to follow-up with PCP if her symptoms are not improving.  She agrees to plan of care.    Final Clinical Impressions(s) / UC Diagnoses   Final diagnoses:  Influenza A     Discharge Instructions      Take the Tamiflu as directed.  Take Tylenol or ibuprofen as needed for fever or discomfort.  Follow-up with your primary care provider if your symptoms are not improving.      ED Prescriptions     Medication Sig Dispense Auth. Provider   oseltamivir (TAMIFLU) 75 MG capsule Take 1 capsule (75 mg total) by mouth every 12 (twelve) hours. 10 capsule Mickie Bail, NP      PDMP not reviewed this encounter.   Mickie Bail, NP 11/24/20 [phone]

## 2020-11-24 NOTE — ED Triage Notes (Signed)
PT c/o cough, chest congestion, ST, and fever x 3 days

## 2020-12-05 ENCOUNTER — Telehealth: Payer: BC Managed Care – PPO | Admitting: Nurse Practitioner

## 2020-12-05 DIAGNOSIS — R051 Acute cough: Secondary | ICD-10-CM

## 2020-12-05 DIAGNOSIS — J0140 Acute pansinusitis, unspecified: Secondary | ICD-10-CM | POA: Diagnosis not present

## 2020-12-05 MED ORDER — AMOXICILLIN-POT CLAVULANATE 875-125 MG PO TABS: 1.0000 | ORAL_TABLET | Freq: Two times a day (BID) | ORAL | 0 refills | Status: AC

## 2020-12-05 NOTE — Progress Notes (Signed)
Virtual Visit Consent   Erica Stafford, you are scheduled for a virtual visit with a [facility] provider today.     Just as with appointments in the office, your consent must be obtained to participate.  Your consent will be active for this visit and any virtual visit you may have with one of our providers in the next 365 days.     If you have a MyChart account, a copy of this consent can be sent to you electronically.  All virtual visits are billed to your insurance company just like a traditional visit in the office.    As this is a virtual visit, video technology does not allow for your provider to perform a traditional examination.  This may limit your provider's ability to fully assess your condition.  If your provider identifies any concerns that need to be evaluated in person or the need to arrange testing (such as labs, EKG, etc.), we will make arrangements to do so.     Although advances in technology are sophisticated, we cannot ensure that it will always work on either your end or our end.  If the connection with a video visit is poor, the visit may have to be switched to a telephone visit.  With either a video or telephone visit, we are not always able to ensure that we have a secure connection.     I need to obtain your verbal consent now.   Are you willing to proceed with your visit today?    Erica Stafford has provided verbal consent on 12/05/2020 for a virtual visit (video or telephone).   Viviano Simas, FNP   Date: 12/05/2020 8:11 AM   Virtual Visit via Video Note   I, Viviano Simas, connected with  Erica Stafford  ([id number], [birth year]) on 12/05/20 at  8:15 AM EST by a video-enabled telemedicine application and verified that I am speaking with the correct person using two identifiers.  Location: Patient: Virtual Visit Location Patient: Home Provider: Virtual Visit Location Provider: Home Office   I discussed the limitations of evaluation and management by  telemedicine and the availability of in person appointments. The patient expressed understanding and agreed to proceed.    History of Present Illness: Erica Stafford is a 35 y.o. who identifies as a female who was assigned female at birth, and is being seen today with complaints of ongoing nasal congestion and ear pain.   She tested positive for flu 11/12. She took Tamiflu and was improving. Had not had a fever since the 16th. She has had ongoing nasal congestion and ear pain that wors[place]   She has been using  Mucinex over the counter  Problems:  Patient Active Problem List   Diagnosis Date Noted   Hx of colposcopy with cervical biopsy 03/26/2020   Abnormal Pap smear of cervix 01/22/2019   Well woman exam with routine gynecological exam 01/17/2019    Allergies: No Known Allergies Medications:  Current Outpatient Medications:    amphetamine-dextroamphetamine (ADDERALL) 5 MG tablet, Take 5 mg by mouth daily., Disp: , Rfl:    aspirin 325 MG tablet, Take 325 mg by mouth once., Disp: , Rfl:    oseltamivir (TAMIFLU) 75 MG capsule, Take 1 capsule (75 mg total) by mouth every 12 (twelve) hours., Disp: 10 capsule, Rfl: 0  Observations/Objective: Patient is well-developed, well-nourished in no acute distress.  Resting comfortably at home.  Head is normocephalic, atraumatic.  No labored  breathing.  Speech is clear and coherent with logical content.  Patient is alert and oriented at baseline.    Assessment and Plan:   Follow Up Instructions: I discussed the assessment and treatment plan with the patient. The patient was provided an opportunity to ask questions and all were answered. The patient agreed with the plan and demonstrated an understanding of the instructions.  A copy of instructions were sent to the patient via MyChart unless otherwise noted below.    The patient was advised to call back or seek an in-person evaluation if the symptoms  worsen or if the condition fails to improve as anticipated.  Time:  I spent 10 minutes with the patient via telehealth technology discussing the above problems/concerns.    Viviano Simas, FNP

## 2020-12-16 DIAGNOSIS — L905 Scar conditions and fibrosis of skin: Secondary | ICD-10-CM | POA: Diagnosis not present

## 2020-12-16 DIAGNOSIS — D225 Melanocytic nevi of trunk: Secondary | ICD-10-CM | POA: Diagnosis not present

## 2021-01-23 DIAGNOSIS — F4310 Post-traumatic stress disorder, unspecified: Secondary | ICD-10-CM | POA: Diagnosis not present

## 2021-01-23 DIAGNOSIS — M9904 Segmental and somatic dysfunction of sacral region: Secondary | ICD-10-CM | POA: Diagnosis not present

## 2021-01-23 DIAGNOSIS — M9902 Segmental and somatic dysfunction of thoracic region: Secondary | ICD-10-CM | POA: Diagnosis not present

## 2021-01-23 DIAGNOSIS — F902 Attention-deficit hyperactivity disorder, combined type: Secondary | ICD-10-CM | POA: Diagnosis not present

## 2021-01-23 DIAGNOSIS — M5386 Other specified dorsopathies, lumbar region: Secondary | ICD-10-CM | POA: Diagnosis not present

## 2021-01-23 DIAGNOSIS — M9903 Segmental and somatic dysfunction of lumbar region: Secondary | ICD-10-CM | POA: Diagnosis not present

## 2021-02-14 ENCOUNTER — Encounter: Payer: Self-pay | Admitting: Radiology

## 2021-02-24 DIAGNOSIS — F902 Attention-deficit hyperactivity disorder, combined type: Secondary | ICD-10-CM | POA: Diagnosis not present

## 2021-03-18 DIAGNOSIS — F902 Attention-deficit hyperactivity disorder, combined type: Secondary | ICD-10-CM | POA: Diagnosis not present

## 2021-03-24 ENCOUNTER — Encounter: Payer: Self-pay | Admitting: Advanced Practice Midwife

## 2021-04-16 DIAGNOSIS — F902 Attention-deficit hyperactivity disorder, combined type: Secondary | ICD-10-CM | POA: Diagnosis not present

## 2021-04-22 DIAGNOSIS — R41840 Attention and concentration deficit: Secondary | ICD-10-CM | POA: Insufficient documentation

## 2021-04-22 DIAGNOSIS — F419 Anxiety disorder, unspecified: Secondary | ICD-10-CM | POA: Insufficient documentation

## 2021-04-22 DIAGNOSIS — I499 Cardiac arrhythmia, unspecified: Secondary | ICD-10-CM | POA: Insufficient documentation

## 2021-04-22 DIAGNOSIS — J45909 Unspecified asthma, uncomplicated: Secondary | ICD-10-CM | POA: Insufficient documentation

## 2021-04-22 DIAGNOSIS — F909 Attention-deficit hyperactivity disorder, unspecified type: Secondary | ICD-10-CM | POA: Insufficient documentation

## 2021-04-22 HISTORY — DX: Cardiac arrhythmia, unspecified: I49.9

## 2021-04-22 NOTE — Patient Instructions (Addendum)
Thank you for choosing [facility] for your Primary Care needs. I am excited for the opportunity to partner with you to meet your health care goals. It was a pleasure meeting you today! ? ?Recommendations from today's visit: ?I recommend using peroxide and allow this to sit in the ear until it stops bubbling and then allow to drain for 3-4 nights in a row to help loosen the wax on top. After that, use the colace I have provided to place in your ear and allow to set for 10 minutes. If you want to try to gently flush the ears after this at home, you can do this. If this clears the ears, then continue to use the peroxide once a week to keep the wax build-up down- if this doesn't work, then let me know and we can flush this for you. ?Monitor the area under your arm. If there are any changes or it doesn't go away or get smaller in the next 1-2 weeks then let me know and we will order an ultrasound of the area. ?We can plan to do your physical and labs whenever it is convenient for you- I can clean out the ears at that time if needed.   ? ?Information on diet, exercise, and health maintenance recommendations are listed below. This is information to help you be sure you are on track for optimal health and monitoring.  ? ?Please look over this and let us know if you have any questions or if you have completed any of the health maintenance outside of [facility] so that we can be sure your records are up to date.  ?___________________________________________________________ ?About Me: ?I am an Adult-Geriatric Nurse Practitioner with a background in caring for patients for more than 20 years with a strong intensive care background. I provide primary care and sports medicine services to patients age 36 and older within this office. My education had a strong focus on caring for the older adult population, which I am passionate about. I am also the director of the APP Fellowship with [facility].  ? ?My desire is to provide you with the best service through preventive medicine and supportive care. I consider you a part of the medical team and value your input. I work diligently to ensure that you are heard and your needs are met in a safe and effective manner. I want you to feel comfortable with me as your provider and want you to know that your health concerns are important to me. ? ?For your information, our office hours are: ?Monday, Tuesday, and Thursday 8:00 AM - 5:00 PM ?Wednesday and Friday 8:00 AM - 12:00 PM.  ? ?In my time away from the office I am teaching new APP's within the system and am unavailable, but my partner, Dr. Burnard Bunting is in the office for emergent needs.  ? ?If you have questions or concerns, please call our office at [phone] or send [place] a MyChart message and we will respond as quickly as possible.  ?____________________________________________________________ ?MyChart:  ?For all urgent or time sensitive needs we ask that you please call the office to avoid delays. Our number is [phone]. ?MyChart is not constantly monitored and due to the large volume of messages a day, replies may take up to 72 business hours. ? ?MyChart Policy: ?MyChart allows for you to see your visit notes, after visit summary, provider recommendations, lab and tests results, make an appointment, request refills, and contact your  provider or the office for non-urgent questions or concerns. Providers are seeing patients during normal business hours and do not have built in time to review MyChart messages.  ?We ask that you allow a minimum of 3 business days for responses to [employer]. For this reason, please do not send urgent requests through [facility]. Please call the office at [phone]. ?New and ongoing conditions may require a visit. We have virtual and in person visit available for your convenience.  ?Complex MyChart concerns may require a visit. Your provider may request you schedule  a virtual or in person visit to ensure we are providing the best care possible. ?MyChart messages sent after 11:00 AM on Friday will not be received by the provider until Monday morning.  ?  ?Lab and Test Results: ?You will receive your lab and test results on MyChart as soon as they are completed and results have been sent by the lab or testing facility. Due to this service, you will receive your results BEFORE your provider.  ?I review lab and tests results each morning prior to seeing patients. Some results require collaboration with other providers to ensure you are receiving the most appropriate care. For this reason, we ask that you please allow a minimum of 3-5 business days from the time the ALL results have been received for your provider to receive and review lab and test results and contact you about these.  ?Most lab and test result comments from the provider will be sent through [place]. Your provider may recommend changes to the plan of care, follow-up visits, repeat testing, ask questions, or request an office visit to discuss these results. You may reply directly to this message or call the office at [phone] to provide information for the provider or set up an appointment. ?In some instances, you will be called with test results and recommendations. Please let us know if this is preferred and we will make note of this in your chart to provide this for you.    ?If you have not heard a response to your lab or test results in 5 business days from all results returning to [facility], please call the office to let us know. We ask that you please avoid calling prior to this time unless there is an emergent concern. Due to high call volumes, this can delay the resulting process. ? ?After Hours: ?For all non-emergency after hours needs, please call the office at [phone] and select the option to reach the on-call provider service. On-call services are shared between multiple [facility] offices and  therefore it will not be possible to speak directly with your provider. On-call providers may provide medical advice and recommendations, but are unable to provide refills for maintenance medications.  ?For all emergency or urgent medical needs after normal business hours, we recommend that you seek care at the closest Urgent Care or Emergency Department to ensure appropriate treatment in a timely manner.  ?[facility] has a 24 hour emergency room located on the ground floor for your convenience.  ? ?Urgent Concerns During the Business Day ?Providers are seeing patients from 8AM to [place] with a busy schedule and are most often not able to respond to non-urgent calls until the end of the day or the next business day. ?If you should have URGENT concerns during the day, please call and speak to the nurse or schedule a same day appointment so that we can address your concern without delay.  ? ?Thank you,  again, for choosing me as your health care partner. I appreciate your trust and look forward to learning more about you.  ? ?Worthy Keeler, DNP, AGNP-c ?___________________________________________________________ ? ?Health Maintenance Recommendations ?Screening Testing ?Mammogram ?Every 1 -2 years based on history and risk factors ?Starting at age 25 ?Pap Smear ?Ages 21-39 every 3 years ?Ages 59-65 every 5 years with HPV testing ?More frequent testing may be required based on results and history ?Colon Cancer Screening ?Every 1-10 years based on test performed, risk factors, and history ?Starting at age 42 ?Bone Density Screening ?Every 2-10 years based on history ?Starting at age 64 for women ?Recommendations for men differ based on medication usage, history, and risk factors ?AAA Screening ?One time ultrasound ?Men 35-24 years old who have every smoked ?Lung Cancer Screening ?Low Dose Lung CT every 12 months ?Age 55-80 years with a 30 pack-year smoking history who still smoke or who have quit  within the last 15 years ? ?Screening Labs ?Routine  Labs: Complete Blood Count (CBC), Complete Metabolic Panel (CMP), Cholesterol (Lipid Panel) ?Every 6-12 months based on history and medications ?May be recom

## 2021-04-22 NOTE — Progress Notes (Signed)
?Tollie Eth, DNP, AGNP-c ?Primary Care & Sports Medicine ?[address]  [address] ?[place], [place] [address] ?[phone] ? ?New patient visit ? ? ?Patient: Erica Stafford   DOB: 1985-04-02   36 y.o. Female  MRN: [id number] ?Visit Date: 04/23/2021 ? ?Patient Care Team: ?Baldemar Dady, Sung Amabile, NP as PCP - General (Nurse Practitioner) ? ?Today's healthcare provider: Tollie Eth, NP  ? ?Chief Complaint  ?Patient presents with  ? Establish Care  ? Cerumen Impaction  ? Mass  ? ?Subjective  ?  ?Erica Stafford is a 36 y.o. female who presents today as a new patient to establish care.  ?  ?Patient endorses the following concerns presently: ?Impacted cerumen  ?Historical problem with cerumen impaction ?Feels that this is present again at this time ?No pain in ears, but decreased hearing present bilaterally ?Has had some vertigo like symptoms  ? ?Mass Under Arm ?Noticed a week ago ?Not painful, mobile ?No changes in skin, history of cancer, recent infection, or recent antibiotics ?No fevers, chills, weight loss, drainage, tenderness ? ?ADHD ?Well controlled on adderall 5mg  once to twice a day depending on the length of the day ?Prescribed by Dr. with [facility]ry at [facility] ? ? ?History reviewed and reveals the following: ?Past Medical History:  ?Diagnosis Date  ? ADHD   ? GERD (gastroesophageal reflux disease)   ? Vaginal Pap smear, abnormal   ? ?Past Surgical History:  ?Procedure Laterality Date  ? CHOLECYSTECTOMY    ? GALLBLADDER SURGERY    ? ?Family Status  ?Relation Name Status  ? MGM  (Not Specified)  ? ?Family History  ?Problem Relation Age of Onset  ? Breast cancer Maternal Grandmother   ? ?Social History  ? ?Socioeconomic History  ? Marital status: Single  ?  Spouse name: Not on file  ? Number of children: Not on file  ? Years of education: Not on file  ? Highest education level: Not on file  ?Occupational History  ? Not on file  ?Tobacco Use  ? Smoking status: Former  ?  Smokeless tobacco: Never  ?Vaping Use  ? Vaping Use: Never used  ?Substance and Sexual Activity  ? Alcohol use: Yes  ? Drug use: No  ? Sexual activity: Yes  ?  Birth control/protection: None  ?Other Topics Concern  ? Not on file  ?Social History Narrative  ? Not on file  ? ?Social Determinants of Health  ? ?Financial Resource Strain: Not on file  ?Food Insecurity: Not on file  ?Transportation Needs: Not on file  ?Physical Activity: Not on file  ?Stress: Not on file  ?Social Connections: Not on file  ? ?Outpatient Medications Prior to Visit  ?Medication Sig  ? amphetamine-dextroamphetamine (ADDERALL) 5 MG tablet Take 5 mg by mouth daily.  ? ?No facility-administered medications prior to visit.  ? ?Allergies  ?Allergen Reactions  ? Strawberry Extract Itching  ? ?Immunization History  ?Administered Date(s) Administered  ? Influenza,inj,Quad PF,6+ Mos 10/12/2019  ? PFIZER(Purple Top)SARS-COV-2 Vaccination 08/30/2019, 03/03/2020  ? Tdap 01/11/2018  ? ? ?Review of Systems ?All review of systems negative except what is listed in the HPI ? ? Objective  ?  ?BP 117/72   Pulse 96   Temp 98.7 ?F (37.1 ?C)   Ht 5\' 2"  (1.575 m)   Wt 175 lb (79.4 kg)   SpO2 96%   BMI 32.01 kg/m?  ?Physical Exam ?Vitals and nursing note reviewed.  ?Constitutional:   ?  General: She is not in acute distress. ?   Appearance: Normal appearance.  ?Eyes:  ?   Extraocular Movements: Extraocular movements intact.  ?   Conjunctiva/sclera: Conjunctivae normal.  ?   Pupils: Pupils are equal, round, and reactive to light.  ?Neck:  ?   Vascular: No carotid bruit.  ?Cardiovascular:  ?   Rate and Rhythm: Normal rate and regular rhythm.  ?   Pulses: Normal pulses.  ?   Heart sounds: Normal heart sounds. No murmur heard. ?Pulmonary:  ?   Effort: Pulmonary effort is normal.  ?   Breath sounds: Normal breath sounds. No wheezing.  ?Abdominal:  ?   General: Bowel sounds are normal.  ?   Palpations: Abdomen is soft.  ?Musculoskeletal:     ?   General: Normal  range of motion.  ?   Cervical back: Normal range of motion.  ?   Right lower leg: No edema.  ?   Left lower leg: No edema.  ?Lymphadenopathy:  ?   Upper Body:  ?   Right upper body: Axillary adenopathy present. No supraclavicular or pectoral adenopathy.  ?   Left upper body: No supraclavicular, axillary or pectoral adenopathy.  ?Skin: ?   General: Skin is warm and dry.  ?   Capillary Refill: Capillary refill takes less than 2 seconds.  ?Neurological:  ?   General: No focal deficit present.  ?   Mental Status: She is alert and oriented to person, place, and time.  ?Psychiatric:     ?   Mood and Affect: Mood normal.     ?   Behavior: Behavior normal.     ?   Thought Content: Thought content normal.     ?   Judgment: Judgment normal.  ? ? ?No results found for any visits on 04/23/21. ? Assessment & Plan   ?  ? ?Problem List Items Addressed This Visit   ? ? Axillary adenopathy  ?  Soft, mobile, non-tender, round lymph node appx 1cm in diameter palpated in right axilla. No concerning findings present.  ?Discussed with patient the option to watchful waiting vs ultrasound study.  ?Joint decision to continue to monitor for a few weeks for resolution and follow-up for imaging if this continues, grows, or new symptoms develop.  ? ?  ?  ? Attention deficit hyperactivity disorder (ADHD)  ?  Chronic. Well controlled at this time. Continue collaboration with BH.  ? ?  ?  ? ?Other Visit Diagnoses   ? ? Encounter for medical examination to establish care    -  Primary  ? ?  ? ? ? ?Return for CPE when convenient- possible ear cleaning the same day.  ?  ? ? ?Wanda Rideout, Sung Amabile, NP, DNP, AGNP-C ?Primary Care & Sports Medicine at [facility] ? [facility]  ? ?

## 2021-04-23 ENCOUNTER — Ambulatory Visit (HOSPITAL_BASED_OUTPATIENT_CLINIC_OR_DEPARTMENT_OTHER): Payer: BC Managed Care – PPO | Admitting: Nurse Practitioner

## 2021-04-23 ENCOUNTER — Encounter (HOSPITAL_BASED_OUTPATIENT_CLINIC_OR_DEPARTMENT_OTHER): Payer: Self-pay | Admitting: Nurse Practitioner

## 2021-04-23 VITALS — BP 117/72 | HR 96 | Temp 98.7°F | Ht 62.0 in | Wt 175.0 lb

## 2021-04-23 DIAGNOSIS — F909 Attention-deficit hyperactivity disorder, unspecified type: Secondary | ICD-10-CM | POA: Diagnosis not present

## 2021-04-23 DIAGNOSIS — Z0000 Encounter for general adult medical examination without abnormal findings: Secondary | ICD-10-CM | POA: Diagnosis not present

## 2021-04-23 DIAGNOSIS — R590 Localized enlarged lymph nodes: Secondary | ICD-10-CM | POA: Diagnosis not present

## 2021-04-24 DIAGNOSIS — F4310 Post-traumatic stress disorder, unspecified: Secondary | ICD-10-CM | POA: Diagnosis not present

## 2021-04-24 DIAGNOSIS — F902 Attention-deficit hyperactivity disorder, combined type: Secondary | ICD-10-CM | POA: Diagnosis not present

## 2021-04-27 ENCOUNTER — Ambulatory Visit (HOSPITAL_BASED_OUTPATIENT_CLINIC_OR_DEPARTMENT_OTHER): Payer: BC Managed Care – PPO

## 2021-04-27 ENCOUNTER — Other Ambulatory Visit (HOSPITAL_BASED_OUTPATIENT_CLINIC_OR_DEPARTMENT_OTHER): Payer: Self-pay

## 2021-04-27 DIAGNOSIS — Z0000 Encounter for general adult medical examination without abnormal findings: Secondary | ICD-10-CM | POA: Diagnosis not present

## 2021-04-28 DIAGNOSIS — R590 Localized enlarged lymph nodes: Secondary | ICD-10-CM

## 2021-04-28 DIAGNOSIS — F909 Attention-deficit hyperactivity disorder, unspecified type: Secondary | ICD-10-CM | POA: Insufficient documentation

## 2021-04-28 HISTORY — DX: Localized enlarged lymph nodes: R59.0

## 2021-04-28 NOTE — Assessment & Plan Note (Signed)
Chronic. Well controlled at this time. Continue collaboration with BH.  ?

## 2021-04-28 NOTE — Assessment & Plan Note (Signed)
Soft, mobile, non-tender, round lymph node appx 1cm in diameter palpated in right axilla. No concerning findings present.  ?Discussed with patient the option to watchful waiting vs ultrasound study.  ?Joint decision to continue to monitor for a few weeks for resolution and follow-up for imaging if this continues, grows, or new symptoms develop.  ?

## 2021-05-03 LAB — CBC WITH DIFFERENTIAL/PLATELET
Basophils Absolute: 0.1 10*3/uL (ref 0.0–0.2)
Basos: 1 %
EOS (ABSOLUTE): 0 10*3/uL (ref 0.0–0.4)
Eos: 1 %
Hematocrit: 40.2 % (ref 34.0–46.6)
Hemoglobin: 13.9 g/dL (ref 11.1–15.9)
Immature Grans (Abs): 0 10*3/uL (ref 0.0–0.1)
Immature Granulocytes: 0 %
Lymphocytes Absolute: 1.8 10*3/uL (ref 0.7–3.1)
Lymphs: 38 %
MCH: 32 pg (ref 26.6–33.0)
MCHC: 34.6 g/dL (ref 31.5–35.7)
MCV: 93 fL (ref 79–97)
Monocytes Absolute: 0.4 10*3/uL (ref 0.1–0.9)
Monocytes: 7 %
Neutrophils Absolute: 2.6 10*3/uL (ref 1.4–7.0)
Neutrophils: 53 %
Platelets: 246 10*3/uL (ref 150–450)
RBC: 4.34 x10E6/uL (ref 3.77–5.28)
RDW: 11.8 % (ref 11.7–15.4)
WBC: 4.8 10*3/uL (ref 3.4–10.8)

## 2021-05-03 LAB — COMPREHENSIVE METABOLIC PANEL
ALT: 16 IU/L (ref 0–32)
AST: 13 IU/L (ref 0–40)
Albumin/Globulin Ratio: 1.9 (ref 1.2–2.2)
Albumin: 4.3 g/dL (ref 3.8–4.8)
Alkaline Phosphatase: 27 IU/L — ABNORMAL LOW (ref 44–121)
BUN/Creatinine Ratio: 16 (ref 9–23)
BUN: 12 mg/dL (ref 6–20)
Bilirubin Total: 0.5 mg/dL (ref 0.0–1.2)
CO2: 22 mmol/L (ref 20–29)
Calcium: 9.1 mg/dL (ref 8.7–10.2)
Chloride: 107 mmol/L — ABNORMAL HIGH (ref 96–106)
Creatinine, Ser: 0.73 mg/dL (ref 0.57–1.00)
Globulin, Total: 2.3 g/dL (ref 1.5–4.5)
Glucose: 93 mg/dL (ref 70–99)
Potassium: 4.2 mmol/L (ref 3.5–5.2)
Sodium: 141 mmol/L (ref 134–144)
Total Protein: 6.6 g/dL (ref 6.0–8.5)
eGFR: 111 mL/min/{1.73_m2} (ref >59)

## 2021-05-03 LAB — LIPID PANEL
Chol/HDL Ratio: 3.1 ratio (ref 0.0–4.4)
Cholesterol, Total: 149 mg/dL (ref 100–199)
HDL: 48 mg/dL (ref >39)
LDL Chol Calc (NIH): 87 mg/dL (ref 0–99)
Triglycerides: 68 mg/dL (ref 0–149)
VLDL Cholesterol Cal: 14 mg/dL (ref 5–40)

## 2021-05-03 LAB — TSH: TSH: 1.97 u[IU]/mL (ref 0.450–4.500)

## 2021-05-03 LAB — VITAMIN D 1,25 DIHYDROXY
Vitamin D 1, 25 (OH)2 Total: 41 pg/mL
Vitamin D2 1, 25 (OH)2: <10 pg/mL
Vitamin D3 1, 25 (OH)2: 37 pg/mL

## 2021-05-03 LAB — HEMOGLOBIN A1C
Est. average glucose Bld gHb Est-mCnc: 94 mg/dL
Hgb A1c MFr Bld: 4.9 % (ref 4.8–5.6)

## 2021-05-06 ENCOUNTER — Ambulatory Visit (INDEPENDENT_AMBULATORY_CARE_PROVIDER_SITE_OTHER): Payer: BC Managed Care – PPO | Admitting: Nurse Practitioner

## 2021-05-06 ENCOUNTER — Other Ambulatory Visit (HOSPITAL_BASED_OUTPATIENT_CLINIC_OR_DEPARTMENT_OTHER): Payer: Self-pay | Admitting: Nurse Practitioner

## 2021-05-06 ENCOUNTER — Encounter (HOSPITAL_BASED_OUTPATIENT_CLINIC_OR_DEPARTMENT_OTHER): Payer: Self-pay | Admitting: Nurse Practitioner

## 2021-05-06 VITALS — BP 128/80 | HR 96 | Temp 98.7°F | Ht 62.0 in | Wt 174.4 lb

## 2021-05-06 DIAGNOSIS — Z0000 Encounter for general adult medical examination without abnormal findings: Secondary | ICD-10-CM | POA: Diagnosis not present

## 2021-05-06 DIAGNOSIS — R748 Abnormal levels of other serum enzymes: Secondary | ICD-10-CM | POA: Diagnosis not present

## 2021-05-06 DIAGNOSIS — R590 Localized enlarged lymph nodes: Secondary | ICD-10-CM

## 2021-05-06 NOTE — Progress Notes (Signed)
? ?BP 128/80   Pulse 96   Temp 98.7 ?F (37.1 ?C)   Ht 5\' 2"  (1.575 m)   Wt 174 lb 6.4 oz (79.1 kg)   SpO2 97%   BMI 31.90 kg/m?   ? ?Subjective:  ? ? Patient ID: , female    DOB: 1985/06/25, 36 y.o.   MRN: [id number] ? ?HPI: ?Erica Stafford is a 36 y.o. female presenting on 05/06/2021 for comprehensive medical examination.  ? ?Current medical concerns include: right axillary adenopathy and rash. Present for several weeks now. Tenderness present. No other associated symptoms.  ? ?She reports regular vision exams q1-5y: yes ?She reports regular dental exams q 85m: yes ?Her diet consists of:  very healthy options  ?She endorses exercise and/or activity of:  crossfit, weight lifting, cardio 2 hours daily ?She works in:  [employer] and [employer] ? ?She denies ETOH use  ?She denies nictoine use  ?She denies illegal substance use  ? ?She reports regular periods with no concerns or menopausal symptoms.  ?She is currently sexually active with one sexual partner, same sex, no pregnancy concerns ?She denies concerns today about STI ? ?She endorses concerns about skin changes today (rash and axillary lump) ?She denies concerns about bowel changes today  ?She denies concerns about bladder changes today  ? ?Most Recent Depression Screen:  ? ?  05/06/2021  ?  8:37 AM 04/23/2021  ?  9:25 AM  ?Depression screen PHQ 2/9  ?Decreased Interest 0 0  ?Down, Depressed, Hopeless 0 0  ?PHQ - 2 Score 0 0  ? ?Most Recent Anxiety Screen:  ?   ? View : No data to display.  ?  ?  ?  ? ?Most Recent Fall Screen: ? ?  05/06/2021  ?  8:37 AM 04/23/2021  ?  9:25 AM  ?Fall Risk   ?Falls in the past year? 0 0  ?Number falls in past yr: 0 0  ?Injury with Fall? 0 0  ?Risk for fall due to : No Fall Risks No Fall Risks  ?Follow up Education provided;Falls evaluation completed Falls evaluation completed;Education provided  ? ? ?All ROS negative except what is listed above and in the HPI.  ? ?Past medical history, surgical history,  medications, allergies, family history and social history reviewed with patient today and changes made to appropriate areas of the chart.  ?Past Medical History:  ?Past Medical History:  ?Diagnosis Date  ? ADHD   ? GERD (gastroesophageal reflux disease)   ? Vaginal Pap smear, abnormal   ? ?Medications:  ?Current Outpatient Medications on File Prior to Visit  ?Medication Sig  ? amphetamine-dextroamphetamine (ADDERALL) 5 MG tablet Take 5 mg by mouth daily.  ? ?No current facility-administered medications on file prior to visit.  ? ?Surgical History:  ?Past Surgical History:  ?Procedure Laterality Date  ? CHOLECYSTECTOMY    ? GALLBLADDER SURGERY    ? ?Allergies:  ?Allergies  ?Allergen Reactions  ? Strawberry Extract Itching  ? ?Social History:  ?Social History  ? ?Socioeconomic History  ? Marital status: Single  ?  Spouse name: Not on file  ? Number of children: Not on file  ? Years of education: Not on file  ? Highest education level: Not on file  ?Occupational History  ? Not on file  ?Tobacco Use  ? Smoking status: Former  ? Smokeless tobacco: Never  ?Vaping Use  ? Vaping Use: Never used  ?Substance and Sexual Activity  ? Alcohol use: Yes  ?  Drug use: No  ? Sexual activity: Yes  ?  Birth control/protection: None  ?Other Topics Concern  ? Not on file  ?Social History Narrative  ? Not on file  ? ?Social Determinants of Health  ? ?Financial Resource Strain: Not on file  ?Food Insecurity: Not on file  ?Transportation Needs: Not on file  ?Physical Activity: Not on file  ?Stress: Not on file  ?Social Connections: Not on file  ?Intimate Partner Violence: Not on file  ? ?Social History  ? ?Tobacco Use  ?Smoking Status Former  ?Smokeless Tobacco Never  ? ?Social History  ? ?Substance and Sexual Activity  ?Alcohol Use Yes  ? ?Family History:  ?Family History  ?Problem Relation Age of Onset  ? Breast cancer Maternal Grandmother   ? ? ?   ?Objective:  ?  ?BP 128/80   Pulse 96   Temp 98.7 ?F (37.1 ?C)   Ht 5\' 2"  (1.575 m)    Wt 174 lb 6.4 oz (79.1 kg)   SpO2 97%   BMI 31.90 kg/m?   ?Wt Readings from Last 3 Encounters:  ?05/06/21 174 lb 6.4 oz (79.1 kg)  ?04/23/21 175 lb (79.4 kg)  ?05/13/20 167 lb 6.4 oz (75.9 kg)  ?  ?Physical Exam ?Vitals and nursing note reviewed.  ?Constitutional:   ?   General: She is not in acute distress. ?   Appearance: Normal appearance.  ?HENT:  ?   Head: Normocephalic and atraumatic.  ?   Right Ear: Hearing, tympanic membrane, ear canal and external ear normal.  ?   Left Ear: Hearing, tympanic membrane, ear canal and external ear normal.  ?   Nose: Nose normal.  ?   Right Sinus: No maxillary sinus tenderness or frontal sinus tenderness.  ?   Left Sinus: No maxillary sinus tenderness or frontal sinus tenderness.  ?   Mouth/Throat:  ?   Lips: Pink.  ?   Mouth: Mucous membranes are moist.  ?   Pharynx: Oropharynx is clear.  ?Eyes:  ?   General: Lids are normal. Vision grossly intact.  ?   Extraocular Movements: Extraocular movements intact.  ?   Conjunctiva/sclera: Conjunctivae normal.  ?   Pupils: Pupils are equal, round, and reactive to light.  ?   Funduscopic exam: ?   Right eye: Red reflex present.     ?   Left eye: Red reflex present. ?   Visual Fields: Right eye visual fields normal and left eye visual fields normal.  ?Neck:  ?   Thyroid: No thyromegaly.  ?   Vascular: No carotid bruit.  ?Cardiovascular:  ?   Rate and Rhythm: Normal rate and regular rhythm.  ?   Chest Wall: PMI is not displaced.  ?   Pulses: Normal pulses.     ?     Dorsalis pedis pulses are 2+ on the right side and 2+ on the left side.  ?     Posterior tibial pulses are 2+ on the right side and 2+ on the left side.  ?   Heart sounds: Normal heart sounds. No murmur heard. ?Pulmonary:  ?   Effort: Pulmonary effort is normal. No respiratory distress.  ?   Breath sounds: Normal breath sounds.  ?Abdominal:  ?   General: Abdomen is flat. Bowel sounds are normal. There is no distension.  ?   Palpations: Abdomen is soft. There is no  hepatomegaly, splenomegaly or mass.  ?   Tenderness: There is no abdominal tenderness. There  is no right CVA tenderness, left CVA tenderness, guarding or rebound.  ?Musculoskeletal:     ?   General: Normal range of motion.  ?   Cervical back: Full passive range of motion without pain, normal range of motion and neck supple. No tenderness.  ?   Right lower leg: No edema.  ?   Left lower leg: No edema.  ?Feet:  ?   Left foot:  ?   Toenail Condition: Left toenails are normal.  ?Lymphadenopathy:  ?   Cervical: No cervical adenopathy.  ?   Upper Body:  ?   Right upper body: Axillary adenopathy present. No supraclavicular adenopathy.  ?   Left upper body: No supraclavicular adenopathy.  ?   Comments: Soft nodularity present to the right axillary region. Mobile, nontender at this time.   ?Skin: ?   General: Skin is warm and dry.  ?   Capillary Refill: Capillary refill takes less than 2 seconds.  ?   Nails: There is no clubbing.  ?Neurological:  ?   General: No focal deficit present.  ?   Mental Status: She is alert and oriented to person, place, and time.  ?   GCS: GCS eye subscore is 4. GCS verbal subscore is 5. GCS motor subscore is 6.  ?   Sensory: Sensation is intact.  ?   Motor: Motor function is intact.  ?   Coordination: Coordination is intact.  ?   Gait: Gait is intact.  ?   Deep Tendon Reflexes: Reflexes are normal and symmetric.  ?Psychiatric:     ?   Attention and Perception: Attention normal.     ?   Mood and Affect: Mood normal.     ?   Speech: Speech normal.     ?   Behavior: Behavior normal. Behavior is cooperative.     ?   Thought Content: Thought content normal.     ?   Cognition and Memory: Cognition and memory normal.     ?   Judgment: Judgment normal.  ? ? ?Results for orders placed or performed in visit on 04/27/21  ?CBC with Differential  ?Result Value Ref Range  ? WBC 4.8 3.4 - 10.8 x10E3/uL  ? RBC 4.34 3.77 - 5.28 x10E6/uL  ? Hemoglobin 13.9 11.1 - 15.9 g/dL  ? Hematocrit 40.2 34.0 - 46.6 %  ? MCV  93 79 - 97 fL  ? MCH 32.0 26.6 - 33.0 pg  ? MCHC 34.6 31.5 - 35.7 g/dL  ? RDW 11.8 11.7 - 15.4 %  ? Platelets 246 150 - 450 x10E3/uL  ? Neutrophils 53 Not Estab. %  ? Lymphs 38 Not Estab. %  ? Monocytes 7 Not Estab. %  ? Eos 1 Not Es

## 2021-05-06 NOTE — Patient Instructions (Signed)
It was a pleasure seeing you today. I hope your time spent with [place] was pleasant and helpful. Please let us know if there is anything we can do to improve the service you receive.  ? ?Today we discussed concerns with: ? ?Elevated serum alkaline phosphatase level ?We will check an ANA and rheumatoid factor today to look to see if there could be autoimmune going on that is causing the joint symptoms you have been experiencing.  ? ? ? ?Important Office Information ?Lab Results ?If labs were ordered, please note that you will see results through MyChart as soon as they come available from [facility].  ?It takes up to 5 business days for the results to be routed to me and for me to review them once all of the lab results have come through from [facility]. I will make recommendations based on your results and send these through MyChart or someone from the office will call you to discuss. If your labs are abnormal, we may contact you to schedule a visit to discuss the results and make recommendations.  ?If you have not heard from [place] within 5 business days or you have waited longer than a week and your lab results have not come through on MyChart, please feel free to call the office or send a message through MyChart to follow-up on these labs.  ? ?Referrals ?If referrals were placed today, the office where the referral was sent will contact you either by phone or through MyChart to set up scheduling. Please note that it can take up to a week for the referral office to contact you. If you do not hear from them in a week, please contact the referral office directly to inquire about scheduling.  ? ?Condition Treated ?If your condition worsens or you begin to have new symptoms, please schedule a follow-up appointment for further evaluation. If you are not sure if an appointment is needed, you may call the office to leave a message for the nurse and someone will contact you with recommendations.  ?If you have an urgent or life  threatening emergency, please do not call the office, but seek emergency evaluation by calling 911 or going to the nearest emergency room for evaluation.  ? ?MyChart and Phone Calls ?Please do not use MyChart for urgent messages. It may take up to 3 business days for MyChart messages to be read by staff and if they are unable to handle the request, an additional 3 business days for them to be routed to me and for my response.  ?Messages sent to the provider through MyChart do not come directly to the provider, please allow time for these messages to be routed and for me to respond.  ?We get a large volume of MyChart messages daily and these are responded to in the order received.  ? ?For urgent messages, please call the office at [phone] and speak with the front office staff or leave a message on the line of my assistant for guidance.  ?We are seeing patients from the hours of 8:00 am through 5:00 pm and calls directly to the nurse may not be answered immediately due to seeing patients, but your call will be returned as soon as possible.  ?Phone  messages received after 4:00 PM Monday through Thursday may not be returned until the following business day. Phone messages received after 11:00 AM on Friday may not be returned until Monday.  ? ?After Hours ?We share on call hours with providers from  other offices. If you have an urgent need after hours that cannot wait until the next business day, please contact the on call provider by calling the office number. A nurse will speak with you and contact the provider if needed for recommendations.  ?If you have an urgent or life threatening emergency after hours, please do not call the on call provider, but seek emergency evaluation by calling 911 or going to the nearest emergency room for evaluation.  ? ?Paperwork ?All paperwork requires a minimum of 5 days to complete and return to you or the designated personnel. Please keep this in mind when bringing in forms or  sending requests for paperwork completion to the office.  ?  ?

## 2021-05-07 LAB — RHEUMATOID FACTOR: Rheumatoid fact SerPl-aCnc: <10 IU/mL (ref <14.0)

## 2021-05-07 LAB — ANA W/REFLEX IF POSITIVE: Anti Nuclear Antibody (ANA): NEGATIVE

## 2021-05-14 ENCOUNTER — Ambulatory Visit: Payer: BC Managed Care – PPO | Admitting: Advanced Practice Midwife

## 2021-05-15 DIAGNOSIS — R748 Abnormal levels of other serum enzymes: Secondary | ICD-10-CM | POA: Insufficient documentation

## 2021-05-15 NOTE — Assessment & Plan Note (Signed)
Soft, mobile, nodularity present in the right axilla. Present on last exam. Does not appear to have changed from last exam 3 weeks ago. No alarm sx present at this time. Due to continued presence, will send order for [place] today for further evaluation and closer examination. Will plan f/u based on results.  ?

## 2021-05-15 NOTE — Assessment & Plan Note (Signed)
CPE today with axillary adenopathy present. No other unusual findings present.  ?Labs from previous visit evaluated with patient today.  ?HM UTD at this time ?No pap needed.  ?F/U in 12 months or sooner if needed.  ?

## 2021-05-15 NOTE — Assessment & Plan Note (Addendum)
Etiology unclear at this time. No alarm sx presently. Patient does have hx of joint pain, will monitor ANA and RF to r/o autoimmune component. Not significantly elevated at this time. All other hepatic functioning normal. Will obtain US of axillary adenopathy. Possibly patients baseline. Will continue to monitor.  ?

## 2021-05-20 ENCOUNTER — Encounter (HOSPITAL_BASED_OUTPATIENT_CLINIC_OR_DEPARTMENT_OTHER): Payer: Self-pay | Admitting: Nurse Practitioner

## 2021-05-20 NOTE — Telephone Encounter (Signed)
Paperwork printed, copied, and placed in provider signature box at nurse station. Patient requests completion by 5/16. ? ?Job description not attached to form and is needed to appropriately answer questions. ? ?Virtual appointment can be recommended per provider preference. Please advise ?

## 2021-05-26 ENCOUNTER — Encounter (HOSPITAL_BASED_OUTPATIENT_CLINIC_OR_DEPARTMENT_OTHER): Payer: Self-pay

## 2021-05-26 ENCOUNTER — Telehealth (HOSPITAL_BASED_OUTPATIENT_CLINIC_OR_DEPARTMENT_OTHER): Payer: Self-pay

## 2021-05-26 NOTE — Telephone Encounter (Signed)
ADA- Accomodation Certification was completed by Minna Merritts, I called the patient to make her aware that this was completed and left at the front desk for her to pick up.  ?

## 2021-05-28 ENCOUNTER — Other Ambulatory Visit (HOSPITAL_BASED_OUTPATIENT_CLINIC_OR_DEPARTMENT_OTHER): Payer: Self-pay | Admitting: Nurse Practitioner

## 2021-05-28 DIAGNOSIS — R748 Abnormal levels of other serum enzymes: Secondary | ICD-10-CM

## 2021-05-28 DIAGNOSIS — R590 Localized enlarged lymph nodes: Secondary | ICD-10-CM

## 2021-05-29 DIAGNOSIS — F902 Attention-deficit hyperactivity disorder, combined type: Secondary | ICD-10-CM | POA: Diagnosis not present

## 2021-06-01 ENCOUNTER — Other Ambulatory Visit: Payer: Self-pay | Admitting: Nurse Practitioner

## 2021-06-01 DIAGNOSIS — R748 Abnormal levels of other serum enzymes: Secondary | ICD-10-CM

## 2021-06-01 DIAGNOSIS — R590 Localized enlarged lymph nodes: Secondary | ICD-10-CM

## 2021-06-03 ENCOUNTER — Encounter (HOSPITAL_BASED_OUTPATIENT_CLINIC_OR_DEPARTMENT_OTHER): Payer: Self-pay

## 2021-06-03 ENCOUNTER — Encounter (HOSPITAL_BASED_OUTPATIENT_CLINIC_OR_DEPARTMENT_OTHER): Payer: Self-pay | Admitting: Nurse Practitioner

## 2021-06-04 ENCOUNTER — Ambulatory Visit
Admission: RE | Admit: 2021-06-04 | Discharge: 2021-06-04 | Disposition: A | Discharge status: Home / Self Care | Payer: BC Managed Care – PPO | Source: Ambulatory Visit | Attending: Nurse Practitioner | Admitting: Nurse Practitioner

## 2021-06-04 DIAGNOSIS — R748 Abnormal levels of other serum enzymes: Secondary | ICD-10-CM

## 2021-06-04 DIAGNOSIS — R590 Localized enlarged lymph nodes: Secondary | ICD-10-CM

## 2021-06-04 DIAGNOSIS — N6489 Other specified disorders of breast: Secondary | ICD-10-CM | POA: Diagnosis not present

## 2021-06-04 DIAGNOSIS — Z803 Family history of malignant neoplasm of breast: Secondary | ICD-10-CM | POA: Diagnosis not present

## 2021-07-02 ENCOUNTER — Telehealth: Payer: BC Managed Care – PPO | Admitting: Physician Assistant

## 2021-07-02 ENCOUNTER — Encounter (HOSPITAL_BASED_OUTPATIENT_CLINIC_OR_DEPARTMENT_OTHER): Payer: Self-pay | Admitting: Nurse Practitioner

## 2021-07-02 DIAGNOSIS — J029 Acute pharyngitis, unspecified: Secondary | ICD-10-CM | POA: Diagnosis not present

## 2021-07-02 NOTE — Progress Notes (Signed)

## 2021-07-05 ENCOUNTER — Encounter: Payer: Self-pay | Admitting: Emergency Medicine

## 2021-07-05 ENCOUNTER — Ambulatory Visit
Admission: EM | Admit: 2021-07-05 | Discharge: 2021-07-05 | Disposition: A | Discharge status: Home / Self Care | Payer: BC Managed Care – PPO | Attending: Urgent Care | Admitting: Urgent Care

## 2021-07-05 ENCOUNTER — Telehealth: Payer: BC Managed Care – PPO | Admitting: Family

## 2021-07-05 DIAGNOSIS — H60391 Other infective otitis externa, right ear: Secondary | ICD-10-CM | POA: Diagnosis not present

## 2021-07-05 DIAGNOSIS — H109 Unspecified conjunctivitis: Secondary | ICD-10-CM

## 2021-07-05 DIAGNOSIS — J0110 Acute frontal sinusitis, unspecified: Secondary | ICD-10-CM

## 2021-07-05 DIAGNOSIS — H1031 Unspecified acute conjunctivitis, right eye: Secondary | ICD-10-CM | POA: Diagnosis not present

## 2021-07-05 DIAGNOSIS — H65111 Acute and subacute allergic otitis media (mucoid) (sanguinous) (serous), right ear: Secondary | ICD-10-CM

## 2021-07-05 MED ORDER — MOXIFLOXACIN HCL 0.5 % OP SOLN: 1.0000 [drp] | Freq: Three times a day (TID) | OPHTHALMIC | 0 refills | Status: DC

## 2021-07-05 MED ORDER — AMOXICILLIN-POT CLAVULANATE 875-125 MG PO TABS: 1.0000 | ORAL_TABLET | Freq: Two times a day (BID) | ORAL | 0 refills | Status: AC

## 2021-07-05 MED ORDER — CIPROFLOXACIN-DEXAMETHASONE 0.3-0.1 % OT SUSP: 4.0000 [drp] | Freq: Two times a day (BID) | OTIC | 0 refills | Status: AC

## 2021-07-24 DIAGNOSIS — F4310 Post-traumatic stress disorder, unspecified: Secondary | ICD-10-CM | POA: Diagnosis not present

## 2021-07-24 DIAGNOSIS — F902 Attention-deficit hyperactivity disorder, combined type: Secondary | ICD-10-CM | POA: Diagnosis not present

## 2021-07-29 ENCOUNTER — Telehealth (INDEPENDENT_AMBULATORY_CARE_PROVIDER_SITE_OTHER): Payer: BC Managed Care – PPO | Admitting: Nurse Practitioner

## 2021-07-29 DIAGNOSIS — F900 Attention-deficit hyperactivity disorder, predominantly inattentive type: Secondary | ICD-10-CM

## 2021-07-29 NOTE — Progress Notes (Signed)
Virtual Visit Encounter telephone visit.   I connected with  Erica Stafford on 09/16/21 at  9:30 AM EDT by secure audio telemedicine application. I verified that I am speaking with the correct person using two identifiers.   I introduced myself as a Publishing rights manager with the practice. The limitations of evaluation and management by telemedicine discussed with the patient and the availability of in person appointments. The patient expressed verbal understanding and consent to proceed.  Participating parties in this visit include: Myself and patient  The patient is: Patient Location: Home I am: Provider Location: Office/Clinic Subjective:    CC and HPI: Erica Stafford is a 36 y.o. year old female presenting for new evaluation and treatment of ADHD. Patient reports the following: Samah tells me that she has previously been seen by a psychiatrist for her adderall prescription.  She reports that recently she had an encounter where the psychiatrist was unprofessional and she is unhappy with the services. She would like me to take over her adderall prescription, if possible. She tells me that she recently received her 3 month supply and will not be due for her next prescription until October.   She endorses she is stable on the medication without any side effects. She is taking the medication as prescribed. She is sleeping well and denies any decreased appetite. She does take intermittent breaks when the medication is not needed.   Past medical history, Surgical history, Family history not pertinant except as noted below, Social history, Allergies, and medications have been entered into the medical record, reviewed, and corrections made.   Review of Systems:  All review of systems negative except what is listed in the HPI  Objective:    Alert and oriented x 4 Speaking in clear sentences with no shortness of breath. No distress.  Impression and Recommendations:    Problem List Items  Addressed This Visit     Attention deficit hyperactivity disorder (ADHD) - Primary    ADHD currently well controlled on adderall 5mg  BID with no alarm symptoms or side effects. PDMP reviewed today. Will plan to continue current management for her with refills in 3 months. Plan to follow-up in 6 months with VV to discuss medication use and effectiveness.        orders and follow up as documented in EMR I discussed the assessment and treatment plan with the patient. The patient was provided an opportunity to ask questions and all were answered. The patient agreed with the plan and demonstrated an understanding of the instructions.   The patient was advised to call back or seek an in-person evaluation if the symptoms worsen or if the condition fails to improve as anticipated.  Follow-Up: MyChart in 3 months for adderall refills- follow-up in 6 months  I provided 17 minutes of non-face-to-face interaction with this non face-to-face encounter including intake, same-day documentation, and chart review.   , NP , DNP, AGNP-c [facility] at [facility] [phone] (fax)

## 2021-07-30 DIAGNOSIS — H52223 Regular astigmatism, bilateral: Secondary | ICD-10-CM | POA: Diagnosis not present

## 2021-07-30 DIAGNOSIS — H5213 Myopia, bilateral: Secondary | ICD-10-CM | POA: Diagnosis not present

## 2021-08-04 DIAGNOSIS — M9902 Segmental and somatic dysfunction of thoracic region: Secondary | ICD-10-CM | POA: Diagnosis not present

## 2021-08-04 DIAGNOSIS — M5386 Other specified dorsopathies, lumbar region: Secondary | ICD-10-CM | POA: Diagnosis not present

## 2021-08-04 DIAGNOSIS — M9904 Segmental and somatic dysfunction of sacral region: Secondary | ICD-10-CM | POA: Diagnosis not present

## 2021-08-04 DIAGNOSIS — M9903 Segmental and somatic dysfunction of lumbar region: Secondary | ICD-10-CM | POA: Diagnosis not present

## 2021-08-20 DIAGNOSIS — M25562 Pain in left knee: Secondary | ICD-10-CM | POA: Diagnosis not present

## 2021-09-16 ENCOUNTER — Encounter (HOSPITAL_BASED_OUTPATIENT_CLINIC_OR_DEPARTMENT_OTHER): Payer: Self-pay | Admitting: Nurse Practitioner

## 2021-09-16 NOTE — Assessment & Plan Note (Signed)
ADHD currently well controlled on adderall 5mg  BID with no alarm symptoms or side effects. PDMP reviewed today. Will plan to continue current management for her with refills in 3 months. Plan to follow-up in 6 months with VV to discuss medication use and effectiveness.

## 2021-10-01 DIAGNOSIS — M5386 Other specified dorsopathies, lumbar region: Secondary | ICD-10-CM | POA: Diagnosis not present

## 2021-10-01 DIAGNOSIS — M9904 Segmental and somatic dysfunction of sacral region: Secondary | ICD-10-CM | POA: Diagnosis not present

## 2021-10-01 DIAGNOSIS — M9902 Segmental and somatic dysfunction of thoracic region: Secondary | ICD-10-CM | POA: Diagnosis not present

## 2021-10-01 DIAGNOSIS — M9903 Segmental and somatic dysfunction of lumbar region: Secondary | ICD-10-CM | POA: Diagnosis not present

## 2021-10-07 DIAGNOSIS — F902 Attention-deficit hyperactivity disorder, combined type: Secondary | ICD-10-CM | POA: Diagnosis not present

## 2021-11-20 ENCOUNTER — Other Ambulatory Visit (HOSPITAL_BASED_OUTPATIENT_CLINIC_OR_DEPARTMENT_OTHER): Payer: Self-pay | Admitting: Nurse Practitioner

## 2021-11-20 ENCOUNTER — Encounter (HOSPITAL_BASED_OUTPATIENT_CLINIC_OR_DEPARTMENT_OTHER): Payer: Self-pay | Admitting: Nurse Practitioner

## 2021-11-20 DIAGNOSIS — F900 Attention-deficit hyperactivity disorder, predominantly inattentive type: Secondary | ICD-10-CM

## 2021-11-23 ENCOUNTER — Other Ambulatory Visit (HOSPITAL_BASED_OUTPATIENT_CLINIC_OR_DEPARTMENT_OTHER): Payer: Self-pay | Admitting: Nurse Practitioner

## 2021-11-23 MED ORDER — AMPHETAMINE-DEXTROAMPHETAMINE 5 MG PO TABS: 5.0000 mg | ORAL_TABLET | Freq: Two times a day (BID) | ORAL | 0 refills | Status: DC

## 2022-01-12 ENCOUNTER — Ambulatory Visit: Payer: BC Managed Care – PPO | Admitting: Nurse Practitioner

## 2022-01-12 ENCOUNTER — Encounter: Payer: Self-pay | Admitting: Nurse Practitioner

## 2022-01-12 VITALS — BP 120/80 | HR 60 | Ht 62.5 in | Wt 179.6 lb

## 2022-01-12 DIAGNOSIS — S90852A Superficial foreign body, left foot, initial encounter: Secondary | ICD-10-CM | POA: Diagnosis not present

## 2022-01-12 NOTE — Progress Notes (Signed)
  Orma Render, DNP, AGNP-c [facility] [address] [place], [place] [address] [phone]  Subjective:   Erica Stafford is a 37 y.o. female presents to day for evaluation of: Foreign Body of Left Foot Murielle noticed a small sore area on the lateral plantar surface of the right foot proximal to the base of the toes about a year ago. She is not sure if there is a foreign body present or not. It is sore with deep palpation. Her wife has tried to remove the object but has not been successful.   PMH, Medications, and Allergies reviewed and updated in chart as appropriate.   ROS negative except for what is listed in HPI. Objective:  There were no vitals taken for this visit. Physical Exam        Assessment & Plan:   Problem List Items Addressed This Visit   None     Orma Render, DNP, AGNP-c 01/12/2022  7:48 AM    History, Medications, Surgery, SDOH, and Family History reviewed and updated as appropriate.

## 2022-01-13 ENCOUNTER — Encounter: Payer: Self-pay | Admitting: Nurse Practitioner

## 2022-01-13 DIAGNOSIS — S90852A Superficial foreign body, left foot, initial encounter: Secondary | ICD-10-CM | POA: Insufficient documentation

## 2022-01-13 HISTORY — DX: Superficial foreign body, left foot, initial encounter: S90.852A

## 2022-01-13 NOTE — Assessment & Plan Note (Signed)
Magnification of area shows linear darkening just under the outer layer of dermis on the lateral plantar surface of the foot proximal to the small toe. Appearance consistent with embedded hair. Area cleansed with betadine and anesthetized with 1%lidocaine buffered with HCO3. #11 blade utilized to make incision into the dermis to expose foreign body. Dark hair visualized with magnification and gripped with forceps. Additional, deeper incisions were required to completely remove the entire hair as it was fragile and in multiple pieces. Additional viewing showed what appeared to be a small, tan grain of sand, which was removed with forceps. Area once again visualized with magnification and no additional foreign substances were noted. No bleeding present, vascularization visible in deeper layers that were not accessed. Area cleansed and dressed with bacitracin and gauze. Recommend keep area clean and dry during healing process. May use bandaid to cover the area. No signs of infection present. Promptly report any redness, warmth, swelling, or oozing.

## 2022-01-18 ENCOUNTER — Encounter: Payer: Self-pay | Admitting: Nurse Practitioner

## 2022-03-22 ENCOUNTER — Encounter: Payer: Self-pay | Admitting: Nurse Practitioner

## 2022-03-22 ENCOUNTER — Ambulatory Visit: Payer: BC Managed Care – PPO | Admitting: Nurse Practitioner

## 2022-03-22 VITALS — BP 122/74 | HR 67 | Wt 179.2 lb

## 2022-03-22 DIAGNOSIS — N6311 Unspecified lump in the right breast, upper outer quadrant: Secondary | ICD-10-CM

## 2022-03-22 DIAGNOSIS — F900 Attention-deficit hyperactivity disorder, predominantly inattentive type: Secondary | ICD-10-CM | POA: Diagnosis not present

## 2022-03-22 HISTORY — DX: Unspecified lump in the right breast, upper outer quadrant: N63.11

## 2022-03-22 MED ORDER — AMPHETAMINE-DEXTROAMPHETAMINE 5 MG PO TABS: 5.0000 mg | ORAL_TABLET | Freq: Two times a day (BID) | ORAL | 0 refills | Status: DC

## 2022-03-22 NOTE — Assessment & Plan Note (Signed)
The patient presents with a palpable lump in the right breast, more noticeable when standing and can be felt outside of the bra. Palpation reveals a firm, non-tender mass to the right breast on the right upper outer quadrant at appx the 10 o'clock position. The lump is not suspected to be a lymph node. At this time, the area does not raise immediate concerns for malignancy based on the clinical examination, however, given that this is a new finding I feel mammogram and ultrasound examination is most appropriate.   Plan: - Proceed with ordering a diagnostic ultrasound and mammogram to assess the right breast lump. - Inform the patient about the possibility of an ultrasound-guided biopsy if the imaging results suggest further investigation is needed. - Reassurance provided.  - Encourage the patient to communicate any questions or concerns that may arise.

## 2022-03-22 NOTE — Patient Instructions (Signed)
I have sent the order in for the mammogram and ultrasound for the breast.  They will call you to schedule.  If anything changes, please let me know.   I have also sent in 3 months of the ADHD medication for you.

## 2022-03-22 NOTE — Assessment & Plan Note (Signed)
The patient has experienced a suspected change in the brand of her short-acting Adderall, leading to temporary symptoms of anxiety, elevated blood pressure, and increased heart rate. These symptoms have resolved. The patient also mentions a stressful work environment and expresses the need for therapy to better manage stress and establish boundaries. Suspect that the brand change may have contributed to the change.   Plan: - Continue to monitor the patient's reaction to the new brand of Adderall, with no adjustments to the medication planned at this juncture. - Three months supply provided for patient.  - Discussed the potential differences in fillers between medication brands and the regulatory constraints on the production of ADHD medications. - Recommend therapy for the patient to aid in stress management and the establishment of boundaries.

## 2022-03-22 NOTE — Progress Notes (Signed)
Orma Render, DNP, AGNP-c [facility] [address] [place], [place] [address] [phone]  Subjective:   Erica Stafford is a 37 y.o. female presents to day for evaluation of: Lump on her right breast Erica Stafford presents today, noting a recently discovered lump in her right breast, identified while showering. She expresses concern, recalling a similar issue last year that led to an ultrasound and mammogram, both of which showed no significant findings. Despite this, she is unsure if the current lump warrants concern and wishes to avoid seeming overly anxious.  She reports a family history of breast issues, mentioning her sister undergoes annual mammograms from the age of 6 due to a monitored spot. Her wife also felt the area and noted that this appears to be a new finding.   Erica Stafford discusses a recent change in her Adderall medication, suspecting it may have induced anxiety and physical symptoms, including high blood pressure and heart palpitations. The dosage was the same, but coloration of the tablet changed indicating possible manufacturer change.  She acknowledges experiencing significant work-related stress and has not attended therapy sessions recently, which could be contributing to her current state of anxiety and discomfort.  PMH, Medications, and Allergies reviewed and updated in chart as appropriate.   ROS negative except for what is listed in HPI. Objective:  BP 122/74   Pulse 67   Wt 179 lb 3.2 oz (81.3 kg)   BMI 32.25 kg/m  Physical Exam Vitals and nursing note reviewed.  Constitutional:      Appearance: Normal appearance. She is normal weight.  HENT:     Head: Normocephalic.  Eyes:     Extraocular Movements: Extraocular movements intact.     Pupils: Pupils are equal, round, and reactive to light.  Pulmonary:     Effort: Pulmonary effort is normal.  Chest:  Breasts:    Right: Mass present.     Left: Normal.    Musculoskeletal:     Cervical  back: Normal range of motion.  Skin:    General: Skin is warm and dry.  Neurological:     General: No focal deficit present.     Mental Status: She is alert and oriented to person, place, and time.  Psychiatric:        Mood and Affect: Mood normal.        Behavior: Behavior normal.           Assessment & Plan:   Problem List Items Addressed This Visit     Attention deficit hyperactivity disorder (ADHD)    The patient has experienced a suspected change in the brand of her short-acting Adderall, leading to temporary symptoms of anxiety, elevated blood pressure, and increased heart rate. These symptoms have resolved. The patient also mentions a stressful work environment and expresses the need for therapy to better manage stress and establish boundaries. Suspect that the brand change may have contributed to the change.   Plan: - Continue to monitor the patient's reaction to the new brand of Adderall, with no adjustments to the medication planned at this juncture. - Three months supply provided for patient.  - Discussed the potential differences in fillers between medication brands and the regulatory constraints on the production of ADHD medications. - Recommend therapy for the patient to aid in stress management and the establishment of boundaries.      Relevant Medications   amphetamine-dextroamphetamine (ADDERALL) 5 MG tablet   amphetamine-dextroamphetamine (ADDERALL) 5 MG tablet (Start on 04/19/2022)   amphetamine-dextroamphetamine (ADDERALL)  5 MG tablet (Start on 05/17/2022)   Breast lump on right side at 10 o'clock position - Primary    The patient presents with a palpable lump in the right breast, more noticeable when standing and can be felt outside of the bra. Palpation reveals a firm, non-tender mass to the right breast on the right upper outer quadrant at appx the 10 o'clock position. The lump is not suspected to be a lymph node. At this time, the area does not raise immediate  concerns for malignancy based on the clinical examination, however, given that this is a new finding I feel mammogram and ultrasound examination is most appropriate.   Plan: - Proceed with ordering a diagnostic ultrasound and mammogram to assess the right breast lump. - Inform the patient about the possibility of an ultrasound-guided biopsy if the imaging results suggest further investigation is needed. - Reassurance provided.  - Encourage the patient to communicate any questions or concerns that may arise.      Relevant Orders   MM Digital Diagnostic Unilat R      Orma Render, DNP, AGNP-c 03/22/2022  8:43 AM    History, Medications, Surgery, SDOH, and Family History reviewed and updated as appropriate.

## 2022-03-23 ENCOUNTER — Other Ambulatory Visit: Payer: Self-pay | Admitting: Nurse Practitioner

## 2022-03-23 DIAGNOSIS — N6311 Unspecified lump in the right breast, upper outer quadrant: Secondary | ICD-10-CM

## 2022-05-01 ENCOUNTER — Other Ambulatory Visit: Payer: Self-pay | Admitting: Family Medicine

## 2022-05-01 DIAGNOSIS — F900 Attention-deficit hyperactivity disorder, predominantly inattentive type: Secondary | ICD-10-CM

## 2022-05-01 MED ORDER — AMPHETAMINE-DEXTROAMPHETAMINE 5 MG PO TABS: 5.0000 mg | ORAL_TABLET | Freq: Two times a day (BID) | ORAL | 0 refills | Status: DC

## 2022-05-03 ENCOUNTER — Ambulatory Visit (HOSPITAL_BASED_OUTPATIENT_CLINIC_OR_DEPARTMENT_OTHER): Payer: Self-pay

## 2022-05-04 ENCOUNTER — Other Ambulatory Visit: Payer: Self-pay | Admitting: Nurse Practitioner

## 2022-05-04 ENCOUNTER — Ambulatory Visit
Admission: RE | Admit: 2022-05-04 | Discharge: 2022-05-04 | Disposition: A | Discharge status: Home / Self Care | Payer: BC Managed Care – PPO | Source: Ambulatory Visit | Attending: Nurse Practitioner | Admitting: Nurse Practitioner

## 2022-05-04 DIAGNOSIS — N6311 Unspecified lump in the right breast, upper outer quadrant: Secondary | ICD-10-CM

## 2022-05-04 DIAGNOSIS — N6489 Other specified disorders of breast: Secondary | ICD-10-CM | POA: Diagnosis not present

## 2022-05-04 DIAGNOSIS — R92333 Mammographic heterogeneous density, bilateral breasts: Secondary | ICD-10-CM | POA: Diagnosis not present

## 2022-05-04 DIAGNOSIS — F900 Attention-deficit hyperactivity disorder, predominantly inattentive type: Secondary | ICD-10-CM

## 2022-05-10 ENCOUNTER — Encounter (HOSPITAL_BASED_OUTPATIENT_CLINIC_OR_DEPARTMENT_OTHER): Payer: BC Managed Care – PPO | Admitting: Nurse Practitioner

## 2022-05-10 ENCOUNTER — Encounter: Payer: Self-pay | Admitting: Nurse Practitioner

## 2022-06-02 DIAGNOSIS — F902 Attention-deficit hyperactivity disorder, combined type: Secondary | ICD-10-CM | POA: Diagnosis not present

## 2022-06-08 DIAGNOSIS — F902 Attention-deficit hyperactivity disorder, combined type: Secondary | ICD-10-CM | POA: Diagnosis not present

## 2022-06-15 ENCOUNTER — Other Ambulatory Visit: Payer: Self-pay | Admitting: Family Medicine

## 2022-06-15 DIAGNOSIS — F900 Attention-deficit hyperactivity disorder, predominantly inattentive type: Secondary | ICD-10-CM

## 2022-06-15 DIAGNOSIS — F902 Attention-deficit hyperactivity disorder, combined type: Secondary | ICD-10-CM | POA: Diagnosis not present

## 2022-06-15 NOTE — Telephone Encounter (Signed)
Refill request last apt 03/22/22.

## 2022-06-16 MED ORDER — AMPHETAMINE-DEXTROAMPHETAMINE 5 MG PO TABS
5.0000 mg | ORAL_TABLET | Freq: Two times a day (BID) | ORAL | 0 refills | Status: DC
Start: 2022-06-16 — End: 2022-08-27

## 2022-08-04 DIAGNOSIS — M2391 Unspecified internal derangement of right knee: Secondary | ICD-10-CM | POA: Diagnosis not present

## 2022-08-04 DIAGNOSIS — M170 Bilateral primary osteoarthritis of knee: Secondary | ICD-10-CM | POA: Diagnosis not present

## 2022-08-17 ENCOUNTER — Encounter: Payer: Self-pay | Admitting: Nurse Practitioner

## 2022-08-27 ENCOUNTER — Other Ambulatory Visit: Payer: Self-pay | Admitting: Nurse Practitioner

## 2022-08-27 DIAGNOSIS — F900 Attention-deficit hyperactivity disorder, predominantly inattentive type: Secondary | ICD-10-CM

## 2022-08-27 NOTE — Telephone Encounter (Signed)
Last apt 03/22/22

## 2022-08-28 ENCOUNTER — Encounter: Payer: Self-pay | Admitting: Nurse Practitioner

## 2022-08-28 DIAGNOSIS — F900 Attention-deficit hyperactivity disorder, predominantly inattentive type: Secondary | ICD-10-CM

## 2022-08-29 MED ORDER — AMPHETAMINE-DEXTROAMPHETAMINE 5 MG PO TABS
5.0000 mg | ORAL_TABLET | Freq: Two times a day (BID) | ORAL | 0 refills | Status: DC
Start: 2022-08-29 — End: 2022-08-31

## 2022-08-29 MED ORDER — AMPHETAMINE-DEXTROAMPHETAMINE 5 MG PO TABS
5.0000 mg | ORAL_TABLET | Freq: Two times a day (BID) | ORAL | 0 refills | Status: AC
Start: 2022-09-26 — End: ?

## 2022-08-30 ENCOUNTER — Telehealth: Payer: Self-pay

## 2022-08-30 DIAGNOSIS — M25561 Pain in right knee: Secondary | ICD-10-CM | POA: Diagnosis not present

## 2022-08-30 DIAGNOSIS — M25562 Pain in left knee: Secondary | ICD-10-CM | POA: Diagnosis not present

## 2022-08-30 NOTE — Telephone Encounter (Signed)
Refill request faxed for dextroamp-amphetamin 5 MG tabs

## 2022-08-31 MED ORDER — AMPHETAMINE-DEXTROAMPHETAMINE 5 MG PO TABS
5.0000 mg | ORAL_TABLET | Freq: Two times a day (BID) | ORAL | 0 refills | Status: DC
Start: 2022-08-31 — End: 2022-10-16

## 2022-10-04 DIAGNOSIS — M170 Bilateral primary osteoarthritis of knee: Secondary | ICD-10-CM | POA: Diagnosis not present

## 2022-10-05 DIAGNOSIS — F902 Attention-deficit hyperactivity disorder, combined type: Secondary | ICD-10-CM | POA: Diagnosis not present

## 2022-10-11 DIAGNOSIS — M170 Bilateral primary osteoarthritis of knee: Secondary | ICD-10-CM | POA: Diagnosis not present

## 2022-10-16 ENCOUNTER — Other Ambulatory Visit: Payer: Self-pay | Admitting: Nurse Practitioner

## 2022-10-16 DIAGNOSIS — F900 Attention-deficit hyperactivity disorder, predominantly inattentive type: Secondary | ICD-10-CM

## 2022-10-18 DIAGNOSIS — M170 Bilateral primary osteoarthritis of knee: Secondary | ICD-10-CM | POA: Diagnosis not present

## 2022-10-18 NOTE — Telephone Encounter (Signed)
Last apt 01/12/22 next apt 11/09/22.

## 2022-10-19 DIAGNOSIS — F902 Attention-deficit hyperactivity disorder, combined type: Secondary | ICD-10-CM | POA: Diagnosis not present

## 2022-10-19 MED ORDER — AMPHETAMINE-DEXTROAMPHETAMINE 5 MG PO TABS
5.0000 mg | ORAL_TABLET | Freq: Two times a day (BID) | ORAL | 0 refills | Status: DC
Start: 2022-10-19 — End: 2022-11-09

## 2022-11-09 ENCOUNTER — Ambulatory Visit (INDEPENDENT_AMBULATORY_CARE_PROVIDER_SITE_OTHER): Payer: BC Managed Care – PPO | Admitting: Nurse Practitioner

## 2022-11-09 ENCOUNTER — Encounter: Payer: Self-pay | Admitting: Nurse Practitioner

## 2022-11-09 VITALS — BP 122/78 | HR 98 | Ht 64.0 in | Wt 188.6 lb

## 2022-11-09 DIAGNOSIS — M94269 Chondromalacia, unspecified knee: Secondary | ICD-10-CM | POA: Insufficient documentation

## 2022-11-09 DIAGNOSIS — F900 Attention-deficit hyperactivity disorder, predominantly inattentive type: Secondary | ICD-10-CM | POA: Diagnosis not present

## 2022-11-09 DIAGNOSIS — M722 Plantar fascial fibromatosis: Secondary | ICD-10-CM | POA: Insufficient documentation

## 2022-11-09 DIAGNOSIS — R29898 Other symptoms and signs involving the musculoskeletal system: Secondary | ICD-10-CM | POA: Diagnosis not present

## 2022-11-09 DIAGNOSIS — Z79899 Other long term (current) drug therapy: Secondary | ICD-10-CM | POA: Diagnosis not present

## 2022-11-09 DIAGNOSIS — M2560 Stiffness of unspecified joint, not elsewhere classified: Secondary | ICD-10-CM | POA: Insufficient documentation

## 2022-11-09 DIAGNOSIS — F4389 Other reactions to severe stress: Secondary | ICD-10-CM | POA: Diagnosis not present

## 2022-11-09 DIAGNOSIS — Z0000 Encounter for general adult medical examination without abnormal findings: Secondary | ICD-10-CM | POA: Diagnosis not present

## 2022-11-09 MED ORDER — AMPHETAMINE-DEXTROAMPHETAMINE 5 MG PO TABS
5.0000 mg | ORAL_TABLET | Freq: Two times a day (BID) | ORAL | 0 refills | Status: DC
Start: 2023-01-13 — End: 2023-05-10

## 2022-11-09 NOTE — Assessment & Plan Note (Signed)

## 2022-11-09 NOTE — Assessment & Plan Note (Signed)
Reports decreased dexterity and swelling in hands, particularly after physical activity. No pain reported. Family history of rheumatoid and osteoarthritis. Previous trauma to hands. -Order blood work to check for inflammation and possible rheumatoid factor.

## 2022-11-09 NOTE — Patient Instructions (Signed)
Managing Stress, Adult Feeling a certain amount of stress is normal. Stress helps our body and mind get ready to deal with the demands of life. Stress hormones can motivate you to do well at work and meet your responsibilities. But severe or long-term (chronic) stress can affect your mental and physical health. Chronic stress puts you at higher risk for: Anxiety and depression. Other health problems such as digestive problems, muscle aches, heart disease, high blood pressure, and stroke. What are the causes? Common causes of stress include: Demands from work, such as deadlines, feeling overworked, or having long hours. Pressures at home, such as money issues, disagreements with a spouse, or parenting issues. Pressures from major life changes, such as divorce, moving, loss of a loved one, or chronic illness. You may be at higher risk for stress-related problems if you: Do not get enough sleep. Are in poor health. Do not have emotional support. Have a mental health disorder such as anxiety or depression. How to recognize stress Stress can make you: Have trouble sleeping. Feel sad, anxious, irritable, or overwhelmed. Lose your appetite. Overeat or want to eat unhealthy foods. Want to use drugs or alcohol. Stress can also cause physical symptoms, such as: Sore, tense muscles, especially in the shoulders and neck. Headaches. Trouble breathing. A faster heart rate. Stomach pain, nausea, or vomiting. Diarrhea or constipation. Trouble concentrating. Follow these instructions at home: Eating and drinking Eat a healthy diet. This includes: Eating foods that are high in fiber, such as beans, whole grains, and fresh fruits and vegetables. Limiting foods that are high in fat and processed sugars, such as fried or sweet foods. Do not skip meals or overeat. Drink enough fluid to keep your urine pale yellow. Alcohol use Do not drink alcohol if: Your health care provider tells you not to  drink. You are pregnant, may be pregnant, or are planning to become pregnant. Drinking alcohol is a way some people try to ease their stress. This can be dangerous, so if you drink alcohol: Limit how much you have to: 0-1 drink a day for women. 0-2 drinks a day for men. Know how much alcohol is in your drink. In the [place] one drink equals one 12 oz bottle of beer (355 mL), one 5 oz glass of wine (148 mL), or one 1 oz glass of hard liquor (44 mL). Activity  Include 30 minutes of exercise in your daily schedule. Exercise is a good stress reducer. Include time in your day for an activity that you find relaxing. Try taking a walk, going on a bike ride, reading a book, or listening to music. Schedule your time in a way that lowers stress, and keep a regular schedule. Focus on doing what is most important to get done. Lifestyle Identify the source of your stress and your reaction to it. See a therapist who can help you change unhelpful reactions. When there are stressful events: Talk about them with family, friends, or coworkers. Try to think realistically about stressful events and not ignore them or overreact. Try to find the positives in a stressful situation and not focus on the negatives. Cut back on responsibilities at work and home, if possible. Ask for help from friends or family members if you need it. Find ways to manage stress, such as: Mindfulness, meditation, or deep breathing. Yoga or tai chi. Progressive muscle relaxation. Spending time in nature. Doing art, playing music, or reading. Making time for fun activities. Spending time with family and friends. Get support   from family, friends, or spiritual resources. General instructions Get enough sleep. Try to go to sleep and get up at about the same time every day. Take over-the-counter and prescription medicines only as told by your health care provider. Do not use any products that contain nicotine or tobacco. These products  include cigarettes, chewing tobacco, and vaping devices, such as e-cigarettes. If you need help quitting, ask your health care provider. Do not use drugs or smoke to deal with stress. Keep all follow-up visits. This is important. Where to find support Talk with your health care provider about stress management or finding a support group. Find a therapist to work with you on your stress management techniques. Where to find more information National Alliance on Mental Illness: www.nami.org American Psychological Association: www.apa.org Contact a health care provider if: Your stress symptoms get worse. You are unable to manage your stress at home. You are struggling to stop using drugs or alcohol. Get help right away if: You may be a danger to yourself or others. You have any thoughts of death or suicide. Get help right awayif you feel like you may hurt yourself or others, or have thoughts about taking your own life. Go to your nearest emergency room or: Call 911. Call the National Suicide Prevention Lifeline at 1-800-273-8255 or 988 in the [place] This is open 24 hours a day. Text the Crisis Text Line at 741741. Summary Feeling a certain amount of stress is normal, but severe or long-term (chronic) stress can affect your mental and physical health. Chronic stress can put you at higher risk for anxiety, depression, and other health problems such as digestive problems, muscle aches, heart disease, high blood pressure, and stroke. You may be at higher risk for stress-related problems if you do not get enough sleep, are in poor health, lack emotional support, or have a mental health disorder such as anxiety or depression. Identify the source of your stress and your reaction to it. Try talking about stressful events with family, friends, or coworkers, finding a coping method, or getting support from spiritual resources. If you need more help, talk with your health care provider about finding a  support group or a mental health therapist. This information is not intended to replace advice given to you by your health care provider. Make sure you discuss any questions you have with your health care provider. Document Revised: 07/24/2020 Document Reviewed: 07/22/2020 Elsevier Patient Education  2024 [employer]  

## 2022-11-09 NOTE — Assessment & Plan Note (Addendum)
Reports high levels of stress and anxiety, including health anxiety. Currently in therapy. Reports brief episodes of feeling as if the world stops around her, particularly during times of stress. No associated tachycardia or dizziness reported. -Consider discussing with therapist for possible stress management strategies. -Continue therapy and stress management strategies.

## 2022-11-09 NOTE — Progress Notes (Signed)
Erica Clamp, DNP, AGNP-c [facility] [address] [place], [place] [address] Main Office [phone]  BP 122/78   Pulse 98   Ht 5\' 4"  (1.626 m)   Wt 188 lb 9.6 oz (85.5 kg)   LMP 10/26/2022   BMI 32.37 kg/m    Subjective:    Patient ID: Erica Stafford, female    DOB: Aug 28, 1985, 37 y.o.   MRN: [id number]  HPI: Erica Stafford is a 37 y.o. female presenting on 11/09/2022 for comprehensive medical examination.   History of Present Illness Erica Stafford is a 37 year old female presenting for her annual physical.   She also has concerns about decreased dexterity and a sensation of thickness in her hands. These symptoms, which began approximately two years ago, are not associated with pain or swelling but have been noted to interfere with the patient's ability to perform tasks requiring fine motor skills, such as writing. The patient also reported a history of trauma to one of her fingers, although this incident occurred after the onset of the hand symptoms. She reports it is difficult to articulate the sensation she is experiencing; however, she reports a notable difference in her hands and the way they feel. She denies pain, numbness, warmth, redness.   In addition to the hand symptoms, the patient reported a peculiar sensation in her feet, particularly noticeable in the mornings. This sensation, described as an awareness of her feet and pain, lasts for about 20 minutes upon waking and is not associated with any swelling. The patient has a history of severe plantar fasciitis and is frequently on her feet due to her occupation. She reports that she never gets up without putting on shoes and stretching her feet first.   The patient also reported a history of joint issues, specifically in her knees. She has been diagnosed with chondromalacia and has been informed that she has a significant loss of cartilage in her knees. The patient receives hyaluronic acid injections for this  condition, which she reports as being helpful. She is not sure the cause of the loss of cartilage but wonders if it correlates with what may be going on in the hands and feet.   The patient also described episodes of difficult to describe emotion and perceived sensation. These episodes, characterized by a sensation of the world stopping around her, are short-lived (less than one second) and have been associated with periods of high stress. The patient has been managing these episodes by implementing stress-reducing strategies and is currently in therapy. She is not sure what is causing the episodes  The patient denied any changes in bowel or bladder habits, and her menstrual cycles are regular. 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 The patient is currently employed in a high-stress job and has been making efforts to reduce her stress levels. She has a history of health anxiety and is hyper-aware of her physical sensations.  Hand/feet "weird"  Unable to grasp without   Pertinent items are noted in HPI.  Most Recent Depression Screen:     11/09/2022    9:32 AM 07/29/2021    9:58 AM 05/06/2021    8:37 AM 04/23/2021    9:25 AM  Depression screen PHQ 2/9  Decreased Interest 0 0 0 0  Down, Depressed, Hopeless 0 0 0 0  PHQ - 2 Score 0 0 0 0   Most Recent Anxiety Screen:  No data to display         Most Recent Fall Screen:    11/09/2022    9:32 AM 01/12/2022    8:16 AM 07/29/2021    9:57 AM 05/06/2021    8:37 AM 04/23/2021    9:25 AM  Fall Risk   Falls in the past year? 0 0 0 0 0  Number falls in past yr: 0 0 0 0 0  Injury with Fall? 0 0 0 0 0  Risk for fall due to : No Fall Risks No Fall Risks Other (Comment) No Fall Risks No Fall Risks  Follow up Falls evaluation completed Falls evaluation completed Falls evaluation completed;Education provided Education provided;Falls evaluation  completed Falls evaluation completed;Education provided    Past medical history, surgical history, medications, allergies, family history and social history reviewed with patient today and changes made to appropriate areas of the chart.  Past Medical History:  Past Medical History:  Diagnosis Date   ADHD    Foreign body in left foot 01/13/2022   GERD (gastroesophageal reflux disease)    Vaginal Pap smear, abnormal    Medications:  Current Outpatient Medications on File Prior to Visit  Medication Sig   glucosamine-chondroitin 500-400 MG tablet Take 1 tablet by mouth daily. (Patient not taking: Reported on 11/09/2022)   No current facility-administered medications on file prior to visit.   Surgical History:  Past Surgical History:  Procedure Laterality Date   CHOLECYSTECTOMY     GALLBLADDER SURGERY     Allergies:  Allergies  Allergen Reactions   Strawberry Extract Itching   Family History:  Family History  Problem Relation Age of Onset   Breast cancer Maternal Grandmother        Objective:    BP 122/78   Pulse 98   Ht 5\' 4"  (1.626 m)   Wt 188 lb 9.6 oz (85.5 kg)   LMP 10/26/2022   BMI 32.37 kg/m   Wt Readings from Last 3 Encounters:  11/09/22 188 lb 9.6 oz (85.5 kg)  03/22/22 179 lb 3.2 oz (81.3 kg)  01/12/22 179 lb 9.6 oz (81.5 kg)    Physical Exam Vitals and nursing note reviewed.  Constitutional:      General: She is not in acute distress.    Appearance: Normal appearance.  HENT:     Head: Normocephalic and atraumatic.     Right Ear: Hearing, tympanic membrane, ear canal and external ear normal.     Left Ear: Hearing, tympanic membrane, ear canal and external ear normal.     Nose: Nose normal.     Right Sinus: No maxillary sinus tenderness or frontal sinus tenderness.     Left Sinus: No maxillary sinus tenderness or frontal sinus tenderness.     Mouth/Throat:     Lips: Pink.     Mouth: Mucous membranes are moist.     Pharynx: Oropharynx is clear.   Eyes:     General: Lids are normal. Vision grossly intact.     Extraocular Movements: Extraocular movements intact.     Conjunctiva/sclera: Conjunctivae normal.     Pupils: Pupils are equal, round, and reactive to light.     Funduscopic exam:    Right eye: Red reflex present.        Left eye: Red reflex present.    Visual Fields: Right eye visual fields normal and left eye visual fields normal.  Neck:     Thyroid: No thyromegaly.     Vascular: No carotid bruit.  Cardiovascular:     Rate and Rhythm: Normal rate and regular rhythm.     Chest Wall: PMI is not displaced.     Pulses: Normal pulses.          Dorsalis pedis pulses are 2+ on the right side and 2+ on the left side.       Posterior tibial pulses are 2+ on the right side and 2+ on the left side.     Heart sounds: Normal heart sounds. No murmur heard. Pulmonary:     Effort: Pulmonary effort is normal. No respiratory distress.     Breath sounds: Normal breath sounds.  Abdominal:     General: Abdomen is flat. Bowel sounds are normal. There is no distension.     Palpations: Abdomen is soft. There is no hepatomegaly, splenomegaly or mass.     Tenderness: There is no abdominal tenderness. There is no right CVA tenderness, left CVA tenderness, guarding or rebound.  Musculoskeletal:        General: Normal range of motion.     Cervical back: Full passive range of motion without pain, normal range of motion and neck supple. No tenderness.     Right lower leg: No edema.     Left lower leg: No edema.  Feet:     Left foot:     Toenail Condition: Left toenails are normal.  Lymphadenopathy:     Cervical: No cervical adenopathy.     Upper Body:     Right upper body: No supraclavicular adenopathy.     Left upper body: No supraclavicular adenopathy.  Skin:    General: Skin is warm and dry.     Capillary Refill: Capillary refill takes less than 2 seconds.     Nails: There is no clubbing.  Neurological:     General: No focal deficit  present.     Mental Status: She is alert and oriented to person, place, and time.     GCS: GCS eye subscore is 4. GCS verbal subscore is 5. GCS motor subscore is 6.     Sensory: Sensation is intact.     Motor: Motor function is intact.     Coordination: Coordination is intact.     Gait: Gait is intact.     Deep Tendon Reflexes: Reflexes are normal and symmetric.  Psychiatric:        Attention and Perception: Attention normal.        Mood and Affect: Mood normal.        Speech: Speech normal.        Behavior: Behavior normal. Behavior is cooperative.        Thought Content: Thought content normal.        Cognition and Memory: Cognition and memory normal.        Judgment: Judgment normal.      Results for orders placed or performed in visit on 05/06/21  Rheumatoid factor  Result Value Ref Range   Rheumatoid fact SerPl-aCnc <10.0 <14.0 IU/mL  ANA w/Reflex if Positive  Result Value Ref Range   Anti Nuclear Antibody (ANA) Negative Negative       Assessment & Plan:   Problem List Items Addressed This Visit     Encounter for annual physical exam - Primary    CPE completed today. Review of HM activities and recommendations discussed and provided on AVS. Anticipatory guidance, diet, and exercise recommendations provided. Medications, allergies, and hx reviewed and updated as necessary. Orders placed as listed below.  Plan: -  Labs ordered. Will make changes as necessary based on results.  - I will review these results and send recommendations via MyChart or a telephone call.  - F/U with CPE in 1 year or sooner for acute/chronic health needs as directed.        Relevant Orders   CBC with Differential/Platelet   CMP14+EGFR   Hemoglobin A1c   Lipid panel   TSH   Anti-CCP Ab, IgG + IgA (RDL)   C-reactive protein   Vitamin B12   Stress-related physiological response affecting physical condition    Reports high levels of stress and anxiety, including health anxiety. Currently in  therapy. Reports brief episodes of feeling as if the world stops around her, particularly during times of stress. No associated tachycardia or dizziness reported. -Consider discussing with therapist for possible stress management strategies. -Continue therapy and stress management strategies.      Relevant Orders   CBC with Differential/Platelet   CMP14+EGFR   Hemoglobin A1c   Lipid panel   TSH   Anti-CCP Ab, IgG + IgA (RDL)   C-reactive protein   Vitamin B12   Hand dysfunction   Relevant Orders   CBC with Differential/Platelet   CMP14+EGFR   Hemoglobin A1c   Lipid panel   TSH   Anti-CCP Ab, IgG + IgA (RDL)   C-reactive protein   Vitamin B12   Joint stiffness    Reports decreased dexterity and swelling in hands, particularly after physical activity. No pain reported. Family history of rheumatoid and osteoarthritis. Previous trauma to hands. -Order blood work to check for inflammation and possible rheumatoid factor.       Relevant Orders   CBC with Differential/Platelet   CMP14+EGFR   Hemoglobin A1c   Lipid panel   TSH   Anti-CCP Ab, IgG + IgA (RDL)   C-reactive protein   Vitamin B12   Plantar fasciitis    Reports unusual sensation in feet upon waking, lasting approximately 20 minutes. History of severe plantar fasciitis. -Continue current management strategies including stretching before standing and wearing shoes.      Chondromalacia of knee    Chondromalacia Reports complete loss of cartilage in knees, receiving hyaluronic acid shots for management. -Continue current treatment plan.      Attention deficit hyperactivity disorder (ADHD)   Relevant Medications   amphetamine-dextroamphetamine (ADDERALL) 5 MG tablet (Start on 01/13/2023)   Other Relevant Orders   CBC with Differential/Platelet   CMP14+EGFR   Hemoglobin A1c   Lipid panel   TSH   Anti-CCP Ab, IgG + IgA (RDL)   C-reactive protein   Vitamin B12      Follow up plan: Return in about 6 months  (around 05/10/2023) for Med Management 30- ADHD.  NEXT PREVENTATIVE PHYSICAL DUE IN 1 YEAR.  PATIENT COUNSELING PROVIDED FOR ALL ADULT PATIENTS: A well balanced diet low in saturated fats, cholesterol, and moderation in carbohydrates.  This can be as simple as monitoring portion sizes and cutting back on sugary beverages such as soda and juice to start with.    Daily water consumption of at least 64 ounces.  Physical activity at least 180 minutes per week.  If just starting out, start 10 minutes a day and work your way up.   This can be as simple as taking the stairs instead of the elevator and walking 2-3 laps around the office  purposefully every day.   STD protection, partner selection, and regular testing if high risk.  Limited consumption of alcoholic beverages if  alcohol is consumed. For men, I recommend no more than 14 alcoholic beverages per week, spread out throughout the week (max 2 per day). Avoid "binge" drinking or consuming large quantities of alcohol in one setting.  Please let me know if you feel you may need help with reduction or quitting alcohol consumption.   Avoidance of nicotine, if used. Please let me know if you feel you may need help with reduction or quitting nicotine use.   Daily mental health attention. This can be in the form of 5 minute daily meditation, prayer, journaling, yoga, reflection, etc.  Purposeful attention to your emotions and mental state can significantly improve your overall wellbeing  and  Health.  Please know that I am here to help you with all of your health care goals and am happy to work with you to find a solution that works best for you.  The greatest advice I have received with any changes in life are to take it one step at a time, that even means if all you can focus on is the next 60 seconds, then do that and celebrate your victories.  With any changes in life, you will have set backs, and that is OK. The important thing to remember  is, if you have a set back, it is not a failure, it is an opportunity to try again! Screening Testing Mammogram Every 1 -2 years based on history and risk factors Starting at age 14 Pap Smear Ages 21-39 every 3 years Ages 68-65 every 5 years with HPV testing More frequent testing may be required based on results and history Colon Cancer Screening Every 1-10 years based on test performed, risk factors, and history Starting at age 65 Bone Density Screening Every 2-10 years based on history Starting at age 52 for women Recommendations for men differ based on medication usage, history, and risk factors AAA Screening One time ultrasound Men 60-11 years old who have every smoked Lung Cancer Screening Low Dose Lung CT every 12 months Age 45-80 years with a 30 pack-year smoking history who still smoke or who have quit within the last 15 years   Screening Labs Routine  Labs: Complete Blood Count (CBC), Complete Metabolic Panel (CMP), Cholesterol (Lipid Panel) Every 6-12 months based on history and medications May be recommended more frequently based on current conditions or previous results Hemoglobin A1c Lab Every 3-12 months based on history and previous results Starting at age 18 or earlier with diagnosis of diabetes, high cholesterol, BMI >26, and/or risk factors Frequent monitoring for patients with diabetes to ensure blood sugar control Thyroid Panel (TSH) Every 6 months based on history, symptoms, and risk factors May be repeated more often if on medication HIV One time testing for all patients 51 and older May be repeated more frequently for patients with increased risk factors or exposure Hepatitis C One time testing for all patients 26 and older May be repeated more frequently for patients with increased risk factors or exposure Gonorrhea, Chlamydia Every 12 months for all sexually active persons 13-24 years Additional monitoring may be recommended for those who are  considered high risk or who have symptoms Every 12 months for any woman on birth control, regardless of sexual activity PSA Men 62-69 years old with risk factors Additional screening may be recommended from age 60-69 based on risk factors, symptoms, and history  Vaccine Recommendations Tetanus Booster All adults every 10 years Flu Vaccine All patients 6 months and older every year COVID Vaccine All  patients 12 years and older Initial dosing with booster May recommend additional booster based on age and health history HPV Vaccine 2 doses all patients age 33-26 Dosing may be considered for patients over 26 Shingles Vaccine (Shingrix) 2 doses all adults 55 years and older Pneumonia (Pneumovax 23) All adults 65 years and older May recommend earlier dosing based on health history One year apart from Prevnar 13 Pneumonia (Prevnar 95) All adults 65 years and older Dosed 1 year after Pneumovax 23 Pneumonia (Prevnar 20) One time alternative to the two dosing of 13 and 23 For all adults with initial dose of 23, 20 is recommended 1 year later For all adults with initial dose of 13, 23 is still recommended as second option 1 year later

## 2022-11-09 NOTE — Assessment & Plan Note (Signed)
Chondromalacia Reports complete loss of cartilage in knees, receiving hyaluronic acid shots for management. -Continue current treatment plan.

## 2022-11-09 NOTE — Assessment & Plan Note (Signed)
Reports unusual sensation in feet upon waking, lasting approximately 20 minutes. History of severe plantar fasciitis. -Continue current management strategies including stretching before standing and wearing shoes.

## 2022-11-12 LAB — LIPID PANEL
Chol/HDL Ratio: 3.8 ratio (ref 0.0–4.4)
Cholesterol, Total: 156 mg/dL (ref 100–199)
HDL: 41 mg/dL (ref >39)
LDL Chol Calc (NIH): 95 mg/dL (ref 0–99)
Triglycerides: 112 mg/dL (ref 0–149)
VLDL Cholesterol Cal: 20 mg/dL (ref 5–40)

## 2022-11-12 LAB — CBC WITH DIFFERENTIAL/PLATELET
Basophils Absolute: 0.1 10*3/uL (ref 0.0–0.2)
Basos: 1 %
EOS (ABSOLUTE): 0 10*3/uL (ref 0.0–0.4)
Eos: 0 %
Hematocrit: 39.3 % (ref 34.0–46.6)
Hemoglobin: 13.1 g/dL (ref 11.1–15.9)
Immature Grans (Abs): 0 10*3/uL (ref 0.0–0.1)
Immature Granulocytes: 0 %
Lymphocytes Absolute: 1.5 10*3/uL (ref 0.7–3.1)
Lymphs: 21 %
MCH: 31.8 pg (ref 26.6–33.0)
MCHC: 33.3 g/dL (ref 31.5–35.7)
MCV: 95 fL (ref 79–97)
Monocytes Absolute: 0.5 10*3/uL (ref 0.1–0.9)
Monocytes: 6 %
Neutrophils Absolute: 5.3 10*3/uL (ref 1.4–7.0)
Neutrophils: 72 %
Platelets: 268 10*3/uL (ref 150–450)
RBC: 4.12 x10E6/uL (ref 3.77–5.28)
RDW: 11.7 % (ref 11.7–15.4)
WBC: 7.4 10*3/uL (ref 3.4–10.8)

## 2022-11-12 LAB — C-REACTIVE PROTEIN: CRP: 6 mg/L (ref 0–10)

## 2022-11-12 LAB — CMP14+EGFR
ALT: 17 [IU]/L (ref 0–32)
AST: 17 [IU]/L (ref 0–40)
Albumin: 4.2 g/dL (ref 3.9–4.9)
Alkaline Phosphatase: 40 [IU]/L — ABNORMAL LOW (ref 44–121)
BUN/Creatinine Ratio: 20 (ref 9–23)
BUN: 15 mg/dL (ref 6–20)
Bilirubin Total: 0.2 mg/dL (ref 0.0–1.2)
CO2: 23 mmol/L (ref 20–29)
Calcium: 9.7 mg/dL (ref 8.7–10.2)
Chloride: 101 mmol/L (ref 96–106)
Creatinine, Ser: 0.75 mg/dL (ref 0.57–1.00)
Globulin, Total: 2.5 g/dL (ref 1.5–4.5)
Glucose: 84 mg/dL (ref 70–99)
Potassium: 4.5 mmol/L (ref 3.5–5.2)
Sodium: 138 mmol/L (ref 134–144)
Total Protein: 6.7 g/dL (ref 6.0–8.5)
eGFR: 106 mL/min/{1.73_m2} (ref >59)

## 2022-11-12 LAB — TSH: TSH: 2.3 u[IU]/mL (ref 0.450–4.500)

## 2022-11-12 LAB — HEMOGLOBIN A1C
Est. average glucose Bld gHb Est-mCnc: 97 mg/dL
Hgb A1c MFr Bld: 5 % (ref 4.8–5.6)

## 2022-11-12 LAB — VITAMIN B12: Vitamin B-12: 604 pg/mL (ref 232–1245)

## 2022-11-12 LAB — ANTI-CCP AB, IGG + IGA (RDL): Anti-CCP Ab, IgG + IgA (RDL): <20 U (ref <20)

## 2022-11-16 DIAGNOSIS — F902 Attention-deficit hyperactivity disorder, combined type: Secondary | ICD-10-CM | POA: Diagnosis not present

## 2022-12-21 DIAGNOSIS — F902 Attention-deficit hyperactivity disorder, combined type: Secondary | ICD-10-CM | POA: Diagnosis not present

## 2022-12-28 DIAGNOSIS — F902 Attention-deficit hyperactivity disorder, combined type: Secondary | ICD-10-CM | POA: Diagnosis not present

## 2023-01-11 DIAGNOSIS — F902 Attention-deficit hyperactivity disorder, combined type: Secondary | ICD-10-CM | POA: Diagnosis not present

## 2023-02-08 DIAGNOSIS — F902 Attention-deficit hyperactivity disorder, combined type: Secondary | ICD-10-CM | POA: Diagnosis not present

## 2023-02-14 ENCOUNTER — Ambulatory Visit: Payer: BC Managed Care – PPO | Admitting: Nurse Practitioner

## 2023-02-14 ENCOUNTER — Encounter: Payer: Self-pay | Admitting: Nurse Practitioner

## 2023-02-14 VITALS — BP 118/72 | HR 62 | Wt 183.6 lb

## 2023-02-14 DIAGNOSIS — L409 Psoriasis, unspecified: Secondary | ICD-10-CM | POA: Insufficient documentation

## 2023-02-14 MED ORDER — PIMECROLIMUS 1 % EX CREA
TOPICAL_CREAM | CUTANEOUS | 3 refills | Status: DC
Start: 2023-02-14 — End: 2023-02-17

## 2023-02-14 MED ORDER — KETOCONAZOLE 2 % EX SHAM
1.0000 | MEDICATED_SHAMPOO | CUTANEOUS | 3 refills | Status: AC
Start: 2023-02-14 — End: ?

## 2023-02-14 NOTE — Assessment & Plan Note (Signed)
Chronic skin condition with red, itchy, and scaly patches, exacerbated by stress and allergens. Likely autoimmune in nature. Symptoms have persisted for a year, with new patches over the last eight months. Previous treatments with cortisone and eczema creams were ineffective. Discussed the potential for spread to the face and eyelids, and the adverse effects of topical steroids, including rebound symptoms. She does not wish to trial steroid creams again as she has tried and failed these with rebound symptoms. Emphasized stress management and dietary modifications. - Prescribe tacrolimus ointment, apply twice daily for a week, then as needed - Prescribe ketoconazole shampoo, use as a body wash once or twice a week, lather and let sit for at least three minutes before rinsing - Recommend sun exposure or UV light therapy - Suggest oatmeal baths and emollient lotions such as Aquaphor, especially at bedtime - Advise against hot showers and fragranced products - Discuss potential use of Lume deodorant as an alternative - Consider dermatology referral if symptoms do not improve - Consider allergist referral if needed

## 2023-02-14 NOTE — Progress Notes (Signed)
Tollie Eth, DNP, AGNP-c [facility] [address] [place], [place] [address] [phone]   ACUTE VISIT- ESTABLISHED PATIENT  Blood pressure 118/72, pulse 62, weight 183 lb 9.6 oz (83.3 kg).  Subjective:  HPI Erica Stafford is a 38 y.o. female presents to day for evaluation of acute concern(s).   History of Present Illness Erica Stafford presents with skin issues, possibly related to psoriasis.  The patient has been experiencing skin issues for about a year, initially believed to be chemical burns from [employer] products on her axillary region. The condition began with small patches that have enlarged over the past eight months, worsening in winter with persistent itching that does not disrupt sleep. She has a history of similar skin issues since age 58, previously treated with antibiotic ointment. Nipple itching during winter has been present for over a decade, without crusty or yellow discharge. Previous skin issues persistent on the back of the neck. These resolved with a treatment for dandruff, she believes.   Various treatments have been attempted, including lotions from [facility] and [employer], which alleviated itching, and an eczema cream recommended by a nurse friend, which worsened the condition. Extensive use of cortisone cream led to withdrawal symptoms upon discontinuation with a significant increase in pruritis. Currently, she is using a facial care product that has improved the condition around her eyes, but nothing is working on her body.  Potential triggers include exposure to straw and grass, believed to cause allergic reactions. Stress exacerbates symptoms, with worsening noted during high-stress periods in recent months. She is exploring natural deodorant options due to concerns about deodorant use. Dietary changes are being explored, with sugar and dairy noted as potential exacerbating factors. She has tried eliminating certain foods to see if it impacts her  skin condition.  No skin issues are reported on her back or belly. She has a new patch on her eyelid and is concerned about the condition spreading to her face.  ROS negative except for what is listed in HPI. History, Medications, Surgery, SDOH, and Family History reviewed and updated as appropriate.  Objective:  Physical Exam Vitals and nursing note reviewed.  Constitutional:      Appearance: Normal appearance.  Eyes:     Conjunctiva/sclera: Conjunctivae normal.  Skin:    General: Skin is warm and dry.     Capillary Refill: Capillary refill takes less than 2 seconds.     Findings: Rash present. Rash is papular and scaling.          Comments: Axilla and flexural areas on the upper extremities show erythematous scaling plaques with sharp demarcation and texture changes. Rages from 1cm to >10cm noted in areas. No drainage or crusting.   Neurological:     General: No focal deficit present.     Mental Status: She is alert and oriented to person, place, and time.  Psychiatric:        Mood and Affect: Mood normal.        Behavior: Behavior normal.         Assessment & Plan:   Problem List Items Addressed This Visit     Psoriasis - Primary   Chronic skin condition with red, itchy, and scaly patches, exacerbated by stress and allergens. Likely autoimmune in nature. Symptoms have persisted for a year, with new patches over the last eight months. Previous treatments with cortisone and eczema creams were ineffective. Discussed the potential for spread to the face and eyelids, and the adverse effects of  topical steroids, including rebound symptoms. She does not wish to trial steroid creams again as she has tried and failed these with rebound symptoms. Emphasized stress management and dietary modifications. - Prescribe tacrolimus ointment, apply twice daily for a week, then as needed - Prescribe ketoconazole shampoo, use as a body wash once or twice a week, lather and let sit for at least  three minutes before rinsing - Recommend sun exposure or UV light therapy - Suggest oatmeal baths and emollient lotions such as Aquaphor, especially at bedtime - Advise against hot showers and fragranced products - Discuss potential use of Lume deodorant as an alternative - Consider dermatology referral if symptoms do not improve - Consider allergist referral if needed      Relevant Medications   pimecrolimus (ELIDEL) 1 % cream   ketoconazole (NIZORAL) 2 % shampoo      Tollie Eth, DNP, AGNP-c

## 2023-02-14 NOTE — Patient Instructions (Addendum)
If this is not getting better let me know   Psoriasis Psoriasis is a long-term (chronic) skin condition that causes raised, red patches (plaques) to form on the skin. Plaques may show up anywhere on your body. They can be any size or shape. Symptoms of this condition range from mild to very severe. Psoriasis cannot be passed from one person to another (is not contagious). What are the causes? The exact cause of this condition is not known. Psoriasis is an autoimmune disease. With this type of disease, the body's defense system (immune system) mistakenly attacks healthy skin. As a result, too many skin cells form too quickly and create plaques. The disease may start due to a trigger, such as: Damage or trauma to the skin, such as cuts, scrapes, sunburn, and dryness. Not enough exposure to sunlight. Certain medicines. Alcohol or tobacco use. Stress. Infections. What increases the risk? You are more likely to develop this condition if you: Have a family history of psoriasis. Are obese. Are 61-101 years old. Are taking certain medicines. What are the signs or symptoms?  There are different types of psoriasis. You can have more than one type of psoriasis during your life. Sometimes, symptoms get worse for a period of time. These are called flares. Each type of psoriasis has different symptoms. Plaque psoriasis is the most common type. Symptoms include red, raised plaques with a silvery-white coating (scale) that are usually on the scalp, elbows, knees, and back. These plaques may be itchy. Your nails may be pitted and crumbly or fall off. Guttate psoriasis symptoms include small red spots that often show up on your trunk, arms, and legs. These spots may develop after you have been sick, especially with strep throat. Inverse psoriasis symptoms include plaques in your underarm area, under your breasts, or on your genitals, groin, or buttocks. Infection, friction, and heat may cause this type of  psoriasis. Pustular psoriasis symptoms include pus-filled bumps that are painful, red, and swollen on the palms of your hands or the soles of your feet. They can affect mobility. You also may feel fatigued, feverish, weak, or have no appetite. Erythrodermic psoriasis symptoms include bright red skin that may look burned. You may have a fast heartbeat and a body temperature that is too high or too low. You may be itchy or in pain. Sebopsoriasis symptoms include red plaques that have a greasy coating and are often on your scalp, forehead, and face. Psoriatic arthritis causes swollen, painful joints along with scaly skin plaques. Your nails may be pitted and crumbly or fall off. How is this diagnosed? This condition is diagnosed based on your symptoms, family history, and physical exam. Your health care provider may remove a tissue sample (biopsy) for testing. You may also be referred to a health care provider who specializes in skin diseases (dermatologist). How is this treated? There is no cure for this condition, but treatment can help manage it. Goals of treatment include: Helping your skin heal. Reducing itching and inflammation. Slowing the growth of new skin cells. Treating any related conditions. Psoriasis can add to your risk of developing other conditions, such as heart disease, high blood pressure, eye problems, and depression. Treatment varies depending on the severity of your condition. This condition may be treated with: Creams or ointments to help with symptoms. Ultraviolet ray exposure (light therapy or phototherapy). This may include natural sunlight or light therapy in a medical office. Systemic therapy medicines. These medicines can help your body better manage skin cell  turnover and inflammation. Medicines may be given as pills or injections. Biologic medicines. These medicines are usually given as injections or through an IV. These are helpful for many people with severe psoriasis,  but there is an increased risk of infection. Follow these instructions at home: Skin care Moisturize your skin as needed. Only use moisturizers that your health care provider says are okay. Apply cool, wet cloths (cold compresses) to the affected areas. This helps with itching. Do not use a hot tub or take hot showers. Take lukewarm showers and baths. Do not scratch your skin. Lifestyle Maintain a healthy weight. Eat a healthy diet that includes plenty of vegetables, fruits, whole grains, low-fat dairy products, and lean protein. Do not eat a lot of foods that are high in solid fats, added sugars, or salt. Use techniques for stress reduction, such as meditation or yoga. Do not use any products that contain nicotine or tobacco. These products include cigarettes, chewing tobacco, and vaping devices, such as e-cigarettes. If you need help quitting, ask your health care provider. Get safe exposure to the sun as told by your health care provider. This may include spending time outdoors in sunlight. Do not get sunburned. Consider joining a psoriasis support group. General instructions  Take or use over-the-counter and prescription medicines only as told by your health care provider. Keep a journal to help track what triggers an outbreak. Try to avoid any triggers. Do not drink alcohol if your health care provider tells you not to drink. See a mental health therapist if you feel sad, anxious, frustrated, and hopeless. Managing this condition can be challenging and you may feel overwhelmed and depressed. Keep all follow-up visits. Your health care provider may monitor your condition over time to make sure that it does not cause problems or get worse. Where to find support National Psoriasis Foundation: psoriasis.org Where to find more information American Academy of Dermatology: InfoExam.si Contact a health care provider if: You have a fever or chills. Your signs or symptoms gets worse. You have  more redness or warmth in the affected areas. You have new or worsening pain or stiffness in your joints. Your nails break easily or pull away from the nail bed. You feel depressed, frustrated, or hopeless about your condition. Get help right away if: You have thoughts of hurting yourself or others. Get help right away if you feel like you may hurt yourself or others, or have thoughts about taking your own life. Go to your nearest emergency room or: Call 911. Call the National Suicide Prevention Lifeline at (220)103-1025 or 988. This is open 24 hours a day. Text the Crisis Text Line at 832-698-8663. Summary Psoriasis is a long-term (chronic) skin condition that causes raised, red patches (plaques) to form on the skin. There is no cure for this condition, but treatment can help manage it. Treatment varies depending on the severity of your condition. Keep a journal to track what triggers an outbreak. Try to avoid any triggers. This information is not intended to replace advice given to you by your health care provider. Make sure you discuss any questions you have with your health care provider. Document Revised: 03/04/2021 Document Reviewed: 03/04/2021 Elsevier Patient Education  2024 [employer].

## 2023-02-17 ENCOUNTER — Telehealth: Payer: Self-pay

## 2023-02-17 ENCOUNTER — Other Ambulatory Visit: Payer: Self-pay | Admitting: Nurse Practitioner

## 2023-02-17 ENCOUNTER — Encounter: Payer: Self-pay | Admitting: Nurse Practitioner

## 2023-02-17 DIAGNOSIS — L409 Psoriasis, unspecified: Secondary | ICD-10-CM

## 2023-02-17 MED ORDER — TACROLIMUS 0.03 % EX OINT
TOPICAL_OINTMENT | CUTANEOUS | 3 refills | Status: DC
Start: 2023-02-17 — End: 2023-05-10

## 2023-02-17 NOTE — Telephone Encounter (Signed)
 Erica Stafford- Faxed request regarding elidel . States insurance prefers tacrolimus  0.1% or 0.03%  PA team and Erica Stafford- pharmacy states all 3 would need a PA but they are unable to send one because of the error code

## 2023-02-17 NOTE — Telephone Encounter (Signed)
 Pt called and states she sent a My Chart message but she would really like for you to call her when available.

## 2023-02-18 ENCOUNTER — Telehealth: Payer: Self-pay

## 2023-02-18 ENCOUNTER — Other Ambulatory Visit (HOSPITAL_COMMUNITY): Payer: Self-pay

## 2023-02-18 NOTE — Telephone Encounter (Signed)
 Pharmacy Patient Advocate Encounter  Received notification from [facility] that Prior Authorization for Tacrolimus  0.03% ointment  has been APPROVED from 2.7.25 to 2.7.26. Ran test claim, Copay is $10.00. This test claim was processed through [facility]- copay amounts may vary at other pharmacies due to pharmacy/plan contracts, or as the patient moves through the different stages of their insurance plan.  Please see previous encounters as this medication was inquired about.   Rx is currently being filled and is ready for pickup

## 2023-02-18 NOTE — Telephone Encounter (Signed)
 Rx will be filled for (2) of the 60grams as the 100gram is out of stock at the moment

## 2023-02-22 DIAGNOSIS — F902 Attention-deficit hyperactivity disorder, combined type: Secondary | ICD-10-CM | POA: Diagnosis not present

## 2023-03-07 DIAGNOSIS — F902 Attention-deficit hyperactivity disorder, combined type: Secondary | ICD-10-CM | POA: Diagnosis not present

## 2023-03-08 DIAGNOSIS — F902 Attention-deficit hyperactivity disorder, combined type: Secondary | ICD-10-CM | POA: Diagnosis not present

## 2023-03-22 DIAGNOSIS — F902 Attention-deficit hyperactivity disorder, combined type: Secondary | ICD-10-CM | POA: Diagnosis not present

## 2023-04-05 DIAGNOSIS — F902 Attention-deficit hyperactivity disorder, combined type: Secondary | ICD-10-CM | POA: Diagnosis not present

## 2023-05-03 DIAGNOSIS — F902 Attention-deficit hyperactivity disorder, combined type: Secondary | ICD-10-CM | POA: Diagnosis not present

## 2023-05-10 ENCOUNTER — Ambulatory Visit (INDEPENDENT_AMBULATORY_CARE_PROVIDER_SITE_OTHER): Payer: BC Managed Care – PPO | Admitting: Nurse Practitioner

## 2023-05-10 ENCOUNTER — Encounter: Payer: Self-pay | Admitting: Nurse Practitioner

## 2023-05-10 VITALS — BP 124/82 | HR 68 | Wt 183.6 lb

## 2023-05-10 DIAGNOSIS — F900 Attention-deficit hyperactivity disorder, predominantly inattentive type: Secondary | ICD-10-CM | POA: Diagnosis not present

## 2023-05-10 DIAGNOSIS — R21 Rash and other nonspecific skin eruption: Secondary | ICD-10-CM

## 2023-05-10 DIAGNOSIS — L409 Psoriasis, unspecified: Secondary | ICD-10-CM | POA: Diagnosis not present

## 2023-05-10 MED ORDER — KETOCONAZOLE 2 % EX CREA
1.0000 | TOPICAL_CREAM | Freq: Every day | CUTANEOUS | 3 refills | Status: AC
Start: 2023-05-10 — End: ?

## 2023-05-10 MED ORDER — TRIAMCINOLONE ACETONIDE 0.1 % EX CREA
1.0000 | TOPICAL_CREAM | Freq: Two times a day (BID) | CUTANEOUS | 2 refills | Status: AC
Start: 2023-05-10 — End: ?

## 2023-05-10 MED ORDER — AMPHETAMINE-DEXTROAMPHETAMINE 5 MG PO TABS
5.0000 mg | ORAL_TABLET | Freq: Two times a day (BID) | ORAL | 0 refills | Status: DC
Start: 2023-05-10 — End: 2023-05-11

## 2023-05-10 NOTE — Assessment & Plan Note (Signed)
 ADHD is well-controlled with current medication regimen. She reports significant improvement in focus and learning capabilities. Current dosage is effective without increasing anxiety or causing sleep disturbances. - Continue Adderall 5 mg, 1-2 times daily as needed. - Instruct her to send a MyChart message when nearing the end of the current three-month supply for prescription renewal.

## 2023-05-10 NOTE — Assessment & Plan Note (Signed)
 Chronic dermatitis with persistent lesions, particularly under the right underarm. Previous treatment with tacrolimus  was too expensive. Current regimen includes Nizoral  cream and triamcinolone. Sunlight exposure has been beneficial without exacerbation. - Prescribe Nizoral  cream. - Prescribe triamcinolone cream for use up to 7 days straight, followed by a 7-day break, then restart as needed. - Encourage continued sunlight exposure as tolerated. - Consider use of a dermatology sun lamp if condition worsens.

## 2023-05-10 NOTE — Progress Notes (Signed)
 Dell Fennel, DNP, AGNP-c [facility]  [address] [place], [place] [address] [phone]  ESTABLISHED PATIENT- Chronic Health and/or Follow-Up Visit  Blood pressure 124/82, pulse 68, weight 183 lb 9.6 oz (83.3 kg).    Erica Stafford is a 38 y.o. year old female presenting today for evaluation and management of ADHD  History of Present Illness Erica Stafford is a 38 year old female who presents for ADHD and skin rash follow-up.  She has been dealing with a persistent skin rash. She initially used tacrolimus  daily for three weeks, which improved most areas except for one persistent spot. She now applies it two to three times a week. Hot water exacerbates the rash, and she has tried to reduce the heat of her showers. The rash on her leg has left a permanent texture change and occasional itching. She also used a shampoo treatment on her scalp, which was effective. Her partner, Mylinda Asa, also has a similar rash.  She is currently managing her ADD well with Adderall, taking 5 mg once or twice a day. She reports significant improvement in her learning and work performance, noting that she wishes she had started the medication sooner. No issues with sleep or increased anxiety related to the medication.  All ROS negative with exception of what is listed above.   PHYSICAL EXAM Physical Exam Vitals and nursing note reviewed.  Constitutional:      General: She is not in acute distress.    Appearance: Normal appearance. She is normal weight.  HENT:     Head: Normocephalic.  Eyes:     Conjunctiva/sclera: Conjunctivae normal.  Neck:     Vascular: No carotid bruit.  Cardiovascular:     Rate and Rhythm: Normal rate and regular rhythm.     Pulses: Normal pulses.     Heart sounds: Normal heart sounds.  Pulmonary:     Effort: Pulmonary effort is normal.     Breath sounds: Normal breath sounds.  Abdominal:     General: Bowel sounds are normal. There is no distension.      Palpations: Abdomen is soft.     Tenderness: There is no abdominal tenderness. There is no guarding.  Musculoskeletal:     Cervical back: Normal range of motion.     Right lower leg: No edema.     Left lower leg: No edema.  Skin:    General: Skin is warm and dry.     Capillary Refill: Capillary refill takes less than 2 seconds.  Neurological:     General: No focal deficit present.     Mental Status: She is alert and oriented to person, place, and time.  Psychiatric:        Mood and Affect: Mood normal.        Behavior: Behavior normal.      PLAN Problem List Items Addressed This Visit     Attention deficit hyperactivity disorder (ADHD) - Primary   ADHD is well-controlled with current medication regimen. She reports significant improvement in focus and learning capabilities. Current dosage is effective without increasing anxiety or causing sleep disturbances. - Continue Adderall 5 mg, 1-2 times daily as needed. - Instruct her to send a MyChart message when nearing the end of the current three-month supply for prescription renewal.      Relevant Medications   amphetamine -dextroamphetamine  (ADDERALL) 5 MG tablet   Psoriasis   Chronic dermatitis with persistent lesions, particularly under the right underarm. Previous treatment with tacrolimus  was too expensive. Current  regimen includes Nizoral  cream and triamcinolone. Sunlight exposure has been beneficial without exacerbation. - Prescribe Nizoral  cream. - Prescribe triamcinolone cream for use up to 7 days straight, followed by a 7-day break, then restart as needed. - Encourage continued sunlight exposure as tolerated. - Consider use of a dermatology sun lamp if condition worsens.      Other Visit Diagnoses       Rash and nonspecific skin eruption       Relevant Medications   ketoconazole  (NIZORAL ) 2 % cream   triamcinolone cream (KENALOG) 0.1 %       Return for CPE- october.  Dell Fennel, DNP, AGNP-c

## 2023-05-10 NOTE — Patient Instructions (Addendum)
 I have sent a 3 month supply of your Adderall to the pharmacy. When you have about 2 weeks remaining, please reach out and I will send the next 3 months.   I sent in both nizoral  cream and triamcinolone for you for the rash. Try to get some sunlight to help this as well.   I will plan to see you in October for your physical and then we will plan to follow-up annual from there unless you need me.

## 2023-05-11 ENCOUNTER — Telehealth: Payer: Self-pay | Admitting: Internal Medicine

## 2023-05-11 ENCOUNTER — Other Ambulatory Visit: Payer: Self-pay | Admitting: Nurse Practitioner

## 2023-05-11 DIAGNOSIS — F900 Attention-deficit hyperactivity disorder, predominantly inattentive type: Secondary | ICD-10-CM

## 2023-05-11 MED ORDER — AMPHETAMINE-DEXTROAMPHETAMINE 5 MG PO TABS
5.0000 mg | ORAL_TABLET | Freq: Two times a day (BID) | ORAL | 0 refills | Status: DC
Start: 2023-07-06 — End: 2023-11-11

## 2023-05-11 MED ORDER — AMPHETAMINE-DEXTROAMPHETAMINE 5 MG PO TABS
5.0000 mg | ORAL_TABLET | Freq: Two times a day (BID) | ORAL | 0 refills | Status: DC
Start: 2023-06-08 — End: 2023-11-11

## 2023-05-11 MED ORDER — AMPHETAMINE-DEXTROAMPHETAMINE 5 MG PO TABS
5.0000 mg | ORAL_TABLET | Freq: Two times a day (BID) | ORAL | 0 refills | Status: DC
Start: 2023-05-11 — End: 2023-11-11

## 2023-05-11 NOTE — Telephone Encounter (Signed)
 Copied from CRM [id number]. Topic: Clinical - Prescription Issue >> May 11, 2023  1:23 PM Zipporah Him wrote: Reason for CRM: [facility] pharmacy calling  stating patients prescription for adderall 5 mg was sent over in a 180 day supply by sara early, np. Pharmacy states it would need to be sent in either by a doctor for that amount to be filled, or in 3- 30 day supplies by sara early. Please adjust accordingly.

## 2023-05-11 NOTE — Telephone Encounter (Signed)
 A 90 day prescription for 180 tablets of Adderall 5mg  was sent into the pharmacy. This is not a change to previous prescriptions. Will adjust per request..

## 2023-05-31 DIAGNOSIS — F902 Attention-deficit hyperactivity disorder, combined type: Secondary | ICD-10-CM | POA: Diagnosis not present

## 2023-06-16 DIAGNOSIS — F902 Attention-deficit hyperactivity disorder, combined type: Secondary | ICD-10-CM | POA: Diagnosis not present

## 2023-07-01 DIAGNOSIS — F902 Attention-deficit hyperactivity disorder, combined type: Secondary | ICD-10-CM | POA: Diagnosis not present

## 2023-07-06 ENCOUNTER — Other Ambulatory Visit: Payer: Self-pay

## 2023-07-06 ENCOUNTER — Encounter: Payer: Self-pay | Admitting: Nurse Practitioner

## 2023-07-06 DIAGNOSIS — L309 Dermatitis, unspecified: Secondary | ICD-10-CM

## 2023-07-06 DIAGNOSIS — L409 Psoriasis, unspecified: Secondary | ICD-10-CM

## 2023-07-12 DIAGNOSIS — F902 Attention-deficit hyperactivity disorder, combined type: Secondary | ICD-10-CM | POA: Diagnosis not present

## 2023-07-21 DIAGNOSIS — L309 Dermatitis, unspecified: Secondary | ICD-10-CM | POA: Diagnosis not present

## 2023-07-21 DIAGNOSIS — L304 Erythema intertrigo: Secondary | ICD-10-CM | POA: Diagnosis not present

## 2023-07-31 ENCOUNTER — Emergency Department (HOSPITAL_BASED_OUTPATIENT_CLINIC_OR_DEPARTMENT_OTHER)
Admission: EM | Admit: 2023-07-31 | Discharge: 2023-07-31 | Disposition: A | Discharge status: Home / Self Care | Attending: Emergency Medicine | Admitting: Emergency Medicine

## 2023-07-31 ENCOUNTER — Emergency Department (HOSPITAL_BASED_OUTPATIENT_CLINIC_OR_DEPARTMENT_OTHER)

## 2023-07-31 ENCOUNTER — Other Ambulatory Visit: Payer: Self-pay

## 2023-07-31 ENCOUNTER — Encounter (HOSPITAL_BASED_OUTPATIENT_CLINIC_OR_DEPARTMENT_OTHER): Payer: Self-pay

## 2023-07-31 DIAGNOSIS — R079 Chest pain, unspecified: Secondary | ICD-10-CM | POA: Insufficient documentation

## 2023-07-31 DIAGNOSIS — R0789 Other chest pain: Secondary | ICD-10-CM | POA: Diagnosis not present

## 2023-07-31 LAB — CBC
HCT: 37.9 % (ref 36.0–46.0)
Hemoglobin: 13.2 g/dL (ref 12.0–15.0)
MCH: 32.5 pg (ref 26.0–34.0)
MCHC: 34.8 g/dL (ref 30.0–36.0)
MCV: 93.3 fL (ref 80.0–100.0)
Platelets: 264 K/uL (ref 150–400)
RBC: 4.06 MIL/uL (ref 3.87–5.11)
RDW: 11.4 % — ABNORMAL LOW (ref 11.5–15.5)
WBC: 6.9 K/uL (ref 4.0–10.5)
nRBC: 0 % (ref 0.0–0.2)

## 2023-07-31 LAB — BASIC METABOLIC PANEL WITH GFR
Anion gap: 11 (ref 5–15)
BUN: 14 mg/dL (ref 6–20)
CO2: 26 mmol/L (ref 22–32)
Calcium: 9.3 mg/dL (ref 8.9–10.3)
Chloride: 104 mmol/L (ref 98–111)
Creatinine, Ser: 0.88 mg/dL (ref 0.44–1.00)
GFR, Estimated: >60 mL/min (ref >60)
Glucose, Bld: 97 mg/dL (ref 70–99)
Potassium: 3.8 mmol/L (ref 3.5–5.1)
Sodium: 140 mmol/L (ref 135–145)

## 2023-07-31 LAB — TROPONIN T, HIGH SENSITIVITY: Troponin T High Sensitivity: <15 ng/L (ref <19)

## 2023-07-31 NOTE — ED Triage Notes (Signed)
 Pt c/o CP back into shoulder blade, consistent but dull since approx 1630. Advises 3/10, non-reproducible, pain

## 2023-07-31 NOTE — Discharge Instructions (Signed)
 You have been seen and discharged from the emergency department.  Your EKG, blood work, heart enzyme and chest x-ray were normal today.  I believe your discomfort is most likely coming from musculoskeletal.  You may treat your symptoms with over-the-counter medication.  Follow-up with your primary provider for further evaluation and further care. Take home medications as prescribed. If you have any worsening symptoms or further concerns for your health please return to an emergency department for further evaluation.

## 2023-07-31 NOTE — ED Notes (Signed)
 Pt declined to urinate

## 2023-08-01 NOTE — ED Provider Notes (Signed)
 [place] EMERGENCY DEPARTMENT AT [facility] Provider Note   CSN: [id number] Arrival date & time: 07/31/23  2101     Patient presents with: Chest Pain   Erica Stafford is a 38 y.o. female.   HPI   38 year old female presents emergency department with a dull ache in the left side of the chest.  This has been going on since about 4:00 today, approximately 3 out of 10 in severity.  No associated shortness of breath, cough, hemoptysis, leg swelling.  Did do increased physical activity/pull-ups and lifting the other day.  Prior to Admission medications   Medication Sig Start Date End Date Taking? Authorizing Provider  amphetamine -dextroamphetamine  (ADDERALL) 5 MG tablet Take 1 tablet (5 mg total) by mouth 2 (two) times daily with a meal. 05/11/23   Early, Camie BRAVO, NP  amphetamine -dextroamphetamine  (ADDERALL) 5 MG tablet Take 1 tablet (5 mg total) by mouth 2 (two) times daily with a meal. 06/08/23   Early, Camie BRAVO, NP  amphetamine -dextroamphetamine  (ADDERALL) 5 MG tablet Take 1 tablet (5 mg total) by mouth 2 (two) times daily with a meal. 07/06/23   Early, Camie BRAVO, NP  ketoconazole  (NIZORAL ) 2 % cream Apply 1 Application topically daily. 05/10/23   Early, Sara E, NP  ketoconazole  (NIZORAL ) 2 % shampoo Apply 1 Application topically 2 (two) times a week. Lather and allow to sit for 3-5 minutes before rinsing. 02/14/23   Early, Sara E, NP  triamcinolone  cream (KENALOG ) 0.1 % Apply 1 Application topically 2 (two) times daily. As needed for itching. Take a break after 7 days and restart if needed. 05/10/23   Oris Camie BRAVO, NP    Allergies: Strawberry extract    Review of Systems  Constitutional:  Negative for fever.  Respiratory:  Negative for cough and shortness of breath.   Cardiovascular:  Positive for chest pain. Negative for palpitations and leg swelling.  Gastrointestinal:  Negative for abdominal pain, diarrhea and vomiting.  Skin:  Negative for rash.  Neurological:  Negative for  headaches.    Updated Vital Signs BP 116/75   Pulse 79   Temp (!) 97.5 F (36.4 C)   Resp 16   LMP 07/24/2023 (Approximate)   SpO2 100%   Physical Exam Vitals and nursing note reviewed.  Constitutional:      Appearance: Normal appearance.  HENT:     Head: Normocephalic.     Mouth/Throat:     Mouth: Mucous membranes are moist.  Cardiovascular:     Rate and Rhythm: Normal rate.  Pulmonary:     Effort: Pulmonary effort is normal. No respiratory distress.  Abdominal:     Palpations: Abdomen is soft.     Tenderness: There is no abdominal tenderness.  Musculoskeletal:     Right lower leg: No edema.     Left lower leg: No edema.  Skin:    General: Skin is warm.  Neurological:     Mental Status: She is alert and oriented to person, place, and time. Mental status is at baseline.  Psychiatric:        Mood and Affect: Mood normal.     (all labs ordered are listed, but only abnormal results are displayed) Labs Reviewed  CBC - Abnormal; Notable for the following components:      Result Value   RDW 11.4 (*)    All other components within normal limits  BASIC METABOLIC PANEL WITH GFR  PREGNANCY, URINE  TROPONIN T, HIGH SENSITIVITY  TROPONIN T, HIGH SENSITIVITY  EKG: None  Radiology: [facility] Chest Port 1 View Result Date: 07/31/2023 EXAM: 1 VIEW XRAY OF THE CHEST 07/31/2023 09:29:00 PM COMPARISON: 07/11/2020 CLINICAL HISTORY: 355200 Chest pain [id number]. Pt c/o CP back into shoulder blade, consistent but dull FINDINGS: LUNGS AND PLEURA: No focal pulmonary opacity. No pulmonary edema. No pleural effusion. No pneumothorax. HEART AND MEDIASTINUM: No acute abnormality of the cardiac and mediastinal silhouettes. BONES AND SOFT TISSUES: No acute osseous abnormality. IMPRESSION: 1. No acute cardiopulmonary pathology. Electronically signed by: Franky Stanford MD 07/31/2023 09:35 PM EDT RP Workstation: [id number]     Procedures   Medications Ordered in the ED - No data to display                                   Medical Decision Making Amount and/or Complexity of Data Reviewed Labs: ordered. Radiology: ordered.   38 year old female presents emergency department left-sided chest pain, described as a dull ache, does radiate through to the left shoulder blade.  No other acute symptoms.  Vitals normal stable on arrival.  EKG shows no ischemic changes.  Chest x-ray is unremarkable.  Blood work is reassuring with a negative troponin.  Discomfort is somewhat reproducible with certain positions/palpation.  She is PERC negative in regards to the PE.  At this time I have low suspicion for ACS or emergent condition that requires further evaluation like CT.  She is overall well-appearing and we will plan for outpatient follow-up and symptomatic treatment.  Patient at this time appears safe and stable for discharge and close outpatient follow up. Discharge plan and strict return to ED precautions discussed, patient verbalizes understanding and agreement.     Final diagnoses:  Chest pain, unspecified type    ED Discharge Orders     None          Bari Roxie HERO, DO 08/01/23 0002

## 2023-08-23 DIAGNOSIS — F902 Attention-deficit hyperactivity disorder, combined type: Secondary | ICD-10-CM | POA: Diagnosis not present

## 2023-09-06 DIAGNOSIS — F902 Attention-deficit hyperactivity disorder, combined type: Secondary | ICD-10-CM | POA: Diagnosis not present

## 2023-09-09 DIAGNOSIS — S86811A Strain of other muscle(s) and tendon(s) at lower leg level, right leg, initial encounter: Secondary | ICD-10-CM | POA: Diagnosis not present

## 2023-09-15 DIAGNOSIS — M62838 Other muscle spasm: Secondary | ICD-10-CM | POA: Diagnosis not present

## 2023-09-15 DIAGNOSIS — M79604 Pain in right leg: Secondary | ICD-10-CM | POA: Diagnosis not present

## 2023-09-15 DIAGNOSIS — M25561 Pain in right knee: Secondary | ICD-10-CM | POA: Diagnosis not present

## 2023-09-22 DIAGNOSIS — M62838 Other muscle spasm: Secondary | ICD-10-CM | POA: Diagnosis not present

## 2023-09-22 DIAGNOSIS — M25561 Pain in right knee: Secondary | ICD-10-CM | POA: Diagnosis not present

## 2023-09-22 DIAGNOSIS — M9906 Segmental and somatic dysfunction of lower extremity: Secondary | ICD-10-CM | POA: Diagnosis not present

## 2023-09-22 DIAGNOSIS — M259 Joint disorder, unspecified: Secondary | ICD-10-CM | POA: Diagnosis not present

## 2023-09-22 DIAGNOSIS — M9904 Segmental and somatic dysfunction of sacral region: Secondary | ICD-10-CM | POA: Diagnosis not present

## 2023-09-22 DIAGNOSIS — S86811A Strain of other muscle(s) and tendon(s) at lower leg level, right leg, initial encounter: Secondary | ICD-10-CM | POA: Diagnosis not present

## 2023-09-22 DIAGNOSIS — M9903 Segmental and somatic dysfunction of lumbar region: Secondary | ICD-10-CM | POA: Diagnosis not present

## 2023-10-13 DIAGNOSIS — S86811A Strain of other muscle(s) and tendon(s) at lower leg level, right leg, initial encounter: Secondary | ICD-10-CM | POA: Diagnosis not present

## 2023-10-13 DIAGNOSIS — M259 Joint disorder, unspecified: Secondary | ICD-10-CM | POA: Diagnosis not present

## 2023-10-13 DIAGNOSIS — M62838 Other muscle spasm: Secondary | ICD-10-CM | POA: Diagnosis not present

## 2023-10-13 DIAGNOSIS — M79604 Pain in right leg: Secondary | ICD-10-CM | POA: Diagnosis not present

## 2023-10-20 DIAGNOSIS — L750 Bromhidrosis: Secondary | ICD-10-CM | POA: Diagnosis not present

## 2023-10-20 DIAGNOSIS — L700 Acne vulgaris: Secondary | ICD-10-CM | POA: Diagnosis not present

## 2023-10-20 DIAGNOSIS — L400 Psoriasis vulgaris: Secondary | ICD-10-CM | POA: Diagnosis not present

## 2023-11-01 DIAGNOSIS — M545 Low back pain, unspecified: Secondary | ICD-10-CM | POA: Diagnosis not present

## 2023-11-01 DIAGNOSIS — M9905 Segmental and somatic dysfunction of pelvic region: Secondary | ICD-10-CM | POA: Diagnosis not present

## 2023-11-01 DIAGNOSIS — M9902 Segmental and somatic dysfunction of thoracic region: Secondary | ICD-10-CM | POA: Diagnosis not present

## 2023-11-01 DIAGNOSIS — M9903 Segmental and somatic dysfunction of lumbar region: Secondary | ICD-10-CM | POA: Diagnosis not present

## 2023-11-01 DIAGNOSIS — M9906 Segmental and somatic dysfunction of lower extremity: Secondary | ICD-10-CM | POA: Diagnosis not present

## 2023-11-01 DIAGNOSIS — M25551 Pain in right hip: Secondary | ICD-10-CM | POA: Diagnosis not present

## 2023-11-11 ENCOUNTER — Encounter: Payer: Self-pay | Admitting: Nurse Practitioner

## 2023-11-11 ENCOUNTER — Ambulatory Visit: Payer: BC Managed Care – PPO | Admitting: Nurse Practitioner

## 2023-11-11 VITALS — BP 124/78 | HR 71 | Ht 62.5 in | Wt 187.8 lb

## 2023-11-11 DIAGNOSIS — F900 Attention-deficit hyperactivity disorder, predominantly inattentive type: Secondary | ICD-10-CM

## 2023-11-11 DIAGNOSIS — Z0000 Encounter for general adult medical examination without abnormal findings: Secondary | ICD-10-CM

## 2023-11-11 DIAGNOSIS — L409 Psoriasis, unspecified: Secondary | ICD-10-CM

## 2023-11-11 DIAGNOSIS — Z13228 Encounter for screening for other metabolic disorders: Secondary | ICD-10-CM

## 2023-11-11 DIAGNOSIS — Z1321 Encounter for screening for nutritional disorder: Secondary | ICD-10-CM

## 2023-11-11 DIAGNOSIS — Z1329 Encounter for screening for other suspected endocrine disorder: Secondary | ICD-10-CM

## 2023-11-11 DIAGNOSIS — Z130 Encounter for screening for diseases of the blood and blood-forming organs and certain disorders involving the immune mechanism: Secondary | ICD-10-CM

## 2023-11-11 DIAGNOSIS — R0789 Other chest pain: Secondary | ICD-10-CM

## 2023-11-11 MED ORDER — AMPHETAMINE-DEXTROAMPHETAMINE 5 MG PO TABS: 5.0000 mg | ORAL_TABLET | Freq: Two times a day (BID) | ORAL | 0 refills | Status: AC

## 2023-11-11 NOTE — Assessment & Plan Note (Signed)
 Reported tenderness and palpable area near the xiphoid process. Differential includes muscle-related tenderness or possible inflammed lymph node. No alarming findings on physical exam. - Monitor for changes in the chest area. - Consider ultrasound if symptoms persist or change by the end of November.

## 2023-11-11 NOTE — Assessment & Plan Note (Signed)

## 2023-11-11 NOTE — Progress Notes (Signed)
 Catheline Doing, DNP, AGNP-c [facility] [address] [place], [place] [address] Main Office [phone] VISIT TYPE: CPE on 11/11/2023 Today's Vitals   11/11/23 0938  BP: 124/78  Pulse: 71  Weight: 187 lb 12.8 oz (85.2 kg)  Height: 5' 2.5 (1.588 m)   Body mass index is 33.8 kg/m. BP 124/78   Pulse 71   Ht 5' 2.5 (1.588 m)   Wt 187 lb 12.8 oz (85.2 kg)   BMI 33.80 kg/m   Subjective:    Patient ID: Waddell DELENA Silversmith, female    DOB: 09/02/1985, 38 y.o.   MRN: [id number]  HPI:  History of Present Illness MASSIEL STIPP is a 38 year old female here for her annual exam. She has concerns with a tender nodule near the xiphoid process.  She noticed a nodule near her xiphoid process approximately three months ago. It is tender to touch but not painful otherwise. There has been no recent injury to the area and no changes in size or consistency. She believes the tenderness might be due to frequent palpation.  She has a history of psoriasis and uses Vytema, a non-steroidal medication, which she finds effective despite inconsistent application.  She manages ADHD with 5 mg of medication taken in the morning and afternoon. She experiences brain fog, which she attributes to grief from her father's passing, and is exploring whether it might be related to her ADHD medication. No issues with sleep or palpitations.  She describes a recent injury involving her rectus femoris during a wall walk exercise, leading to calf strain, knee pain, and hip flexor discomfort. She has been undergoing shockwave therapy, cupping, and needling, which she finds beneficial.  She mentions a heightened awareness of her body, leading to anxiety. She notices minor changes in grip strength and coordination, such as dropping a cereal bowl and difficulty with toothbrush handling, but is unsure if these are significant or related to her hyper-awareness.  No fullness in her throat, difficulty swallowing,  changes in hearing or vision, and changes in breast size or tenderness.  Pertinent items are noted in HPI.  Most Recent Depression Screen:     11/11/2023    9:38 AM 05/10/2023    9:06 AM 11/09/2022    9:32 AM 07/29/2021    9:58 AM 05/06/2021    8:37 AM  Depression screen PHQ 2/9  Decreased Interest 0 0 0 0 0  Down, Depressed, Hopeless 0 0 0 0 0  PHQ - 2 Score 0 0 0 0 0   Most Recent Anxiety Screen:      No data to display         Most Recent Fall Screen:    11/11/2023    9:38 AM 05/10/2023    9:06 AM 11/09/2022    9:32 AM 01/12/2022    8:16 AM 07/29/2021    9:57 AM  Fall Risk   Falls in the past year? 0 0 0 0 0  Number falls in past yr: 0 0 0 0 0  Injury with Fall? 0 0 0 0 0  Risk for fall due to : No Fall Risks No Fall Risks No Fall Risks No Fall Risks Other (Comment)  Follow up Falls evaluation completed Falls evaluation completed Falls evaluation completed Falls evaluation completed  Falls evaluation completed;Education provided      Data saved with a previous flowsheet row definition    Past medical history, surgical history, medications, allergies, family history and social history reviewed with patient today  and changes made to appropriate areas of the chart.  Past Medical History:  Past Medical History:  Diagnosis Date   ADHD    Axillary adenopathy 04/28/2021   Breast lump on right side at 10 o'clock position 03/22/2022   Cardiac arrhythmia 04/22/2021   Foreign body in left foot 01/13/2022   GERD (gastroesophageal reflux disease)    Vaginal Pap smear, abnormal    Medications:  Current Outpatient Medications on File Prior to Visit  Medication Sig   ketoconazole  (NIZORAL ) 2 % cream Apply 1 Application topically daily.   ketoconazole  (NIZORAL ) 2 % shampoo Apply 1 Application topically 2 (two) times a week. Lather and allow to sit for 3-5 minutes before rinsing.   VTAMA 1 % CREA Apply topically.   triamcinolone  cream (KENALOG ) 0.1 % Apply 1 Application  topically 2 (two) times daily. As needed for itching. Take a break after 7 days and restart if needed. (Patient not taking: Reported on 11/11/2023)   No current facility-administered medications on file prior to visit.   Surgical History:  Past Surgical History:  Procedure Laterality Date   CHOLECYSTECTOMY     GALLBLADDER SURGERY     Allergies:  Allergies  Allergen Reactions   Strawberry Extract Itching   Family History:  Family History  Problem Relation Age of Onset   Breast cancer Maternal Grandmother        Objective:    BP 124/78   Pulse 71   Ht 5' 2.5 (1.588 m)   Wt 187 lb 12.8 oz (85.2 kg)   BMI 33.80 kg/m   Wt Readings from Last 3 Encounters:  11/11/23 187 lb 12.8 oz (85.2 kg)  05/10/23 183 lb 9.6 oz (83.3 kg)  02/14/23 183 lb 9.6 oz (83.3 kg)    Physical Exam Vitals and nursing note reviewed.  Constitutional:      General: She is not in acute distress.    Appearance: Normal appearance.  HENT:     Head: Normocephalic and atraumatic.     Right Ear: Hearing, tympanic membrane, ear canal and external ear normal.     Left Ear: Hearing, tympanic membrane, ear canal and external ear normal.     Nose: Nose normal.     Right Sinus: No maxillary sinus tenderness or frontal sinus tenderness.     Left Sinus: No maxillary sinus tenderness or frontal sinus tenderness.     Mouth/Throat:     Lips: Pink.     Mouth: Mucous membranes are moist.     Pharynx: Oropharynx is clear.  Eyes:     General: Lids are normal. Vision grossly intact.     Extraocular Movements: Extraocular movements intact.     Conjunctiva/sclera: Conjunctivae normal.     Pupils: Pupils are equal, round, and reactive to light.     Funduscopic exam:    Right eye: Red reflex present.        Left eye: Red reflex present.    Visual Fields: Right eye visual fields normal and left eye visual fields normal.  Neck:     Thyroid: No thyromegaly.     Vascular: No carotid bruit.  Cardiovascular:     Rate  and Rhythm: Normal rate and regular rhythm.     Chest Wall: PMI is not displaced.     Pulses: Normal pulses.          Dorsalis pedis pulses are 2+ on the right side and 2+ on the left side.       Posterior tibial pulses  are 2+ on the right side and 2+ on the left side.     Heart sounds: Normal heart sounds. No murmur heard. Pulmonary:     Effort: Pulmonary effort is normal. No respiratory distress.     Breath sounds: Normal breath sounds.  Abdominal:     General: Abdomen is flat. Bowel sounds are normal. There is no distension.     Palpations: Abdomen is soft. There is no hepatomegaly, splenomegaly or mass.     Tenderness: There is no abdominal tenderness. There is no right CVA tenderness, left CVA tenderness, guarding or rebound.  Musculoskeletal:        General: Tenderness present. Normal range of motion.       Arms:     Cervical back: Full passive range of motion without pain, normal range of motion and neck supple. No tenderness.     Right lower leg: No edema.     Left lower leg: No edema.     Comments: Area of tenderness noted just the left of the sternal border. Palpation consistent with muscle. Single small, mobile, soft lymph node present  Feet:     Left foot:     Toenail Condition: Left toenails are normal.  Lymphadenopathy:     Cervical: No cervical adenopathy.     Upper Body:     Right upper body: No supraclavicular adenopathy.     Left upper body: No supraclavicular adenopathy.  Skin:    General: Skin is warm and dry.     Capillary Refill: Capillary refill takes less than 2 seconds.     Nails: There is no clubbing.  Neurological:     General: No focal deficit present.     Mental Status: She is alert and oriented to person, place, and time.     GCS: GCS eye subscore is 4. GCS verbal subscore is 5. GCS motor subscore is 6.     Sensory: Sensation is intact.     Motor: Motor function is intact.     Coordination: Coordination is intact.     Gait: Gait is intact.      Deep Tendon Reflexes: Reflexes are normal and symmetric.  Psychiatric:        Attention and Perception: Attention normal.        Mood and Affect: Mood normal.        Speech: Speech normal.        Behavior: Behavior normal. Behavior is cooperative.        Thought Content: Thought content normal.        Cognition and Memory: Cognition and memory normal.        Judgment: Judgment normal.      Results for orders placed or performed during the hospital encounter of 07/31/23  Basic metabolic panel   Collection Time: 07/31/23  9:10 PM  Result Value Ref Range   Sodium 140 135 - 145 mmol/L   Potassium 3.8 3.5 - 5.1 mmol/L   Chloride 104 98 - 111 mmol/L   CO2 26 22 - 32 mmol/L   Glucose, Bld 97 70 - 99 mg/dL   BUN 14 6 - 20 mg/dL   Creatinine, Ser 9.11 0.44 - 1.00 mg/dL   Calcium 9.3 8.9 - 89.6 mg/dL   GFR, Estimated >39 >39 mL/min   Anion gap 11 5 - 15  CBC   Collection Time: 07/31/23  9:10 PM  Result Value Ref Range   WBC 6.9 4.0 - 10.5 K/uL   RBC 4.06 3.87 -  5.11 MIL/uL   Hemoglobin 13.2 12.0 - 15.0 g/dL   HCT 62.0 63.9 - 53.9 %   MCV 93.3 80.0 - 100.0 fL   MCH 32.5 26.0 - 34.0 pg   MCHC 34.8 30.0 - 36.0 g/dL   RDW 88.5 (L) 88.4 - 84.4 %   Platelets 264 150 - 400 K/uL   nRBC 0.0 0.0 - 0.2 %  Troponin T, High Sensitivity   Collection Time: 07/31/23  9:10 PM  Result Value Ref Range   Troponin T High Sensitivity <15 <19 ng/L       Assessment & Plan:   Problem List Items Addressed This Visit     Encounter for annual physical exam - Primary   CPE completed today. Review of HM activities and recommendations discussed and provided on AVS. Anticipatory guidance, diet, and exercise recommendations provided. Medications, allergies, and hx reviewed and updated as necessary. Orders placed as listed below.  Plan: - Labs ordered. Will make changes as necessary based on results.  - I will review these results and send recommendations via MyChart or a telephone call.  - F/U with CPE  in 1 year or sooner for acute/chronic health needs as directed.        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 No issues with sleep or palpitations. Discussed potential brain fog associated with ADHD, grief, and medication wearing off. - Continue current ADHD medication regimen. - Encourage self-reflection and monitoring of symptoms.      Relevant Medications   amphetamine -dextroamphetamine  (ADDERALL) 5 MG tablet   amphetamine -dextroamphetamine  (ADDERALL) 5 MG tablet (Start on 12/09/2023)   amphetamine -dextroamphetamine  (ADDERALL) 5 MG tablet (Start on 01/06/2024)   Psoriasis   Psoriasis is well-controlled with Vytema, a non-steroidal topical medication. Reports significant improvement and satisfaction with treatment. Discussed challenges with insurance coverage for biologics and benefits of current treatment. - Continue Vytema for psoriasis management.       Sternum pain   Reported tenderness and palpable area near the xiphoid process. Differential includes muscle-related tenderness or possible inflammed lymph node. No alarming findings on physical exam. - Monitor for changes in the chest area. - Consider ultrasound if symptoms persist or change by the end of November.       Other Visit Diagnoses       Screening for endocrine, nutritional, metabolic and immunity disorder       Relevant Orders   CBC with Differential/Platelet   CMP14+EGFR   Hemoglobin A1c   LP+LDL Direct          Follow up plan: Return in about 1 year (around 11/10/2024) for CPE.  NEXT PREVENTATIVE PHYSICAL DUE IN 1 YEAR.  PATIENT COUNSELING PROVIDED FOR ALL ADULT PATIENTS: A well balanced diet low in saturated fats, cholesterol, and moderation in carbohydrates.  This can be as simple as monitoring portion  sizes and cutting back on sugary beverages such as soda and juice to start with.    Daily water consumption of at least 64 ounces.  Physical activity at least 180 minutes per week.  If just starting out, start 10 minutes a day and work your way up.   This can be as simple as taking the stairs instead of the elevator and walking 2-3 laps around the office  purposefully every day.   STD protection, partner selection, and regular testing  if high risk.  Limited consumption of alcoholic beverages if alcohol is consumed. For men, I recommend no more than 14 alcoholic beverages per week, spread out throughout the week (max 2 per day). Avoid binge drinking or consuming large quantities of alcohol in one setting.  Please let me know if you feel you may need help with reduction or quitting alcohol consumption.   Avoidance of nicotine, if used. Please let me know if you feel you may need help with reduction or quitting nicotine use.   Daily mental health attention. This can be in the form of 5 minute daily meditation, prayer, journaling, yoga, reflection, etc.  Purposeful attention to your emotions and mental state can significantly improve your overall wellbeing  and  Health.  Please know that I am here to help you with all of your health care goals and am happy to work with you to find a solution that works best for you.  The greatest advice I have received with any changes in life are to take it one step at a time, that even means if all you can focus on is the next 60 seconds, then do that and celebrate your victories.  With any changes in life, you will have set backs, and that is OK. The important thing to remember is, if you have a set back, it is not a failure, it is an opportunity to try again! Screening Testing Mammogram Every 1 -2 years based on history and risk factors Starting at age 31 Pap Smear Ages 21-39 every 3 years Ages 59-65 every 5 years with HPV testing More frequent  testing may be required based on results and history Colon Cancer Screening Every 1-10 years based on test performed, risk factors, and history Starting at age 52 Bone Density Screening Every 2-10 years based on history Starting at age 74 for women Recommendations for men differ based on medication usage, history, and risk factors AAA Screening One time ultrasound Men 8-59 years old who have every smoked Lung Cancer Screening Low Dose Lung CT every 12 months Age 67-80 years with a 30 pack-year smoking history who still smoke or who have quit within the last 15 years   Screening Labs Routine  Labs: Complete Blood Count (CBC), Complete Metabolic Panel (CMP), Cholesterol (Lipid Panel) Every 6-12 months based on history and medications May be recommended more frequently based on current conditions or previous results Hemoglobin A1c Lab Every 3-12 months based on history and previous results Starting at age 34 or earlier with diagnosis of diabetes, high cholesterol, BMI >26, and/or risk factors Frequent monitoring for patients with diabetes to ensure blood sugar control Thyroid Panel (TSH) Every 6 months based on history, symptoms, and risk factors May be repeated more often if on medication HIV One time testing for all patients 45 and older May be repeated more frequently for patients with increased risk factors or exposure Hepatitis C One time testing for all patients 8 and older May be repeated more frequently for patients with increased risk factors or exposure Gonorrhea, Chlamydia Every 12 months for all sexually active persons 13-24 years Additional monitoring may be recommended for those who are considered high risk or who have symptoms Every 12 months for any woman on birth control, regardless of sexual activity PSA Men 86-41 years old with risk factors Additional screening may be recommended from age 53-69 based on risk factors, symptoms, and history  Vaccine  Recommendations Tetanus Booster All adults every 10 years Flu Vaccine All  patients 6 months and older every year COVID Vaccine All patients 12 years and older Initial dosing with booster May recommend additional booster based on age and health history HPV Vaccine 2 doses all patients age 46-26 Dosing may be considered for patients over 26 Shingles Vaccine (Shingrix) 2 doses all adults 55 years and older Pneumonia (Pneumovax 23) All adults 65 years and older May recommend earlier dosing based on health history One year apart from Prevnar 13 Pneumonia (Prevnar 6) All adults 65 years and older Dosed 1 year after Pneumovax 23 Pneumonia (Prevnar 20) One time alternative to the two dosing of 13 and 23 For all adults with initial dose of 23, 20 is recommended 1 year later For all adults with initial dose of 13, 23 is still recommended as second option 1 year later

## 2023-11-11 NOTE — Assessment & Plan Note (Signed)
 Psoriasis is well-controlled with Vytema, a non-steroidal topical medication. Reports significant improvement and satisfaction with treatment. Discussed challenges with insurance coverage for biologics and benefits of current treatment. - Continue Vytema for psoriasis management.

## 2023-11-11 NOTE — Assessment & Plan Note (Signed)
 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 No issues with sleep or palpitations. Discussed potential brain fog associated with ADHD, grief, and medication wearing off. - Continue current ADHD medication regimen. - Encourage self-reflection and monitoring of symptoms.

## 2023-11-11 NOTE — Patient Instructions (Signed)

## 2023-11-12 LAB — CBC WITH DIFFERENTIAL/PLATELET
Basophils Absolute: 0.1 x10E3/uL (ref 0.0–0.2)
Basos: 1 %
EOS (ABSOLUTE): 0.1 x10E3/uL (ref 0.0–0.4)
Eos: 1 %
Hematocrit: 42.1 % (ref 34.0–46.6)
Hemoglobin: 13.8 g/dL (ref 11.1–15.9)
Immature Grans (Abs): 0 x10E3/uL (ref 0.0–0.1)
Immature Granulocytes: 0 %
Lymphocytes Absolute: 2.3 x10E3/uL (ref 0.7–3.1)
Lymphs: 40 %
MCH: 32.8 pg (ref 26.6–33.0)
MCHC: 32.8 g/dL (ref 31.5–35.7)
MCV: 100 fL — ABNORMAL HIGH (ref 79–97)
Monocytes Absolute: 0.4 x10E3/uL (ref 0.1–0.9)
Monocytes: 7 %
Neutrophils Absolute: 2.9 x10E3/uL (ref 1.4–7.0)
Neutrophils: 51 %
Platelets: 280 x10E3/uL (ref 150–450)
RBC: 4.21 x10E6/uL (ref 3.77–5.28)
RDW: 11.9 % (ref 11.7–15.4)
WBC: 5.7 x10E3/uL (ref 3.4–10.8)

## 2023-11-12 LAB — CMP14+EGFR
ALT: 14 IU/L (ref 0–32)
AST: 13 IU/L (ref 0–40)
Albumin: 4.3 g/dL (ref 3.9–4.9)
Alkaline Phosphatase: 35 IU/L — ABNORMAL LOW (ref 41–116)
BUN/Creatinine Ratio: 22 (ref 9–23)
BUN: 17 mg/dL (ref 6–20)
Bilirubin Total: 0.3 mg/dL (ref 0.0–1.2)
CO2: 22 mmol/L (ref 20–29)
Calcium: 9.5 mg/dL (ref 8.7–10.2)
Chloride: 105 mmol/L (ref 96–106)
Creatinine, Ser: 0.76 mg/dL (ref 0.57–1.00)
Globulin, Total: 2.6 g/dL (ref 1.5–4.5)
Glucose: 87 mg/dL (ref 70–99)
Potassium: 4.1 mmol/L (ref 3.5–5.2)
Sodium: 141 mmol/L (ref 134–144)
Total Protein: 6.9 g/dL (ref 6.0–8.5)
eGFR: 104 mL/min/1.73 (ref >59)

## 2023-11-12 LAB — LP+LDL DIRECT
Cholesterol, Total: 169 mg/dL (ref 100–199)
HDL: 43 mg/dL (ref >39)
LDL Chol Calc (NIH): 100 mg/dL — ABNORMAL HIGH (ref 0–99)
LDL Direct: 103 mg/dL — ABNORMAL HIGH (ref 0–99)
Triglycerides: 148 mg/dL (ref 0–149)
VLDL Cholesterol Cal: 26 mg/dL (ref 5–40)

## 2023-11-12 LAB — HEMOGLOBIN A1C
Est. average glucose Bld gHb Est-mCnc: 94 mg/dL
Hgb A1c MFr Bld: 4.9 % (ref 4.8–5.6)

## 2023-11-16 ENCOUNTER — Ambulatory Visit: Payer: Self-pay | Admitting: Nurse Practitioner

## 2023-11-19 ENCOUNTER — Encounter: Payer: Self-pay | Admitting: Nurse Practitioner

## 2023-11-22 DIAGNOSIS — M9904 Segmental and somatic dysfunction of sacral region: Secondary | ICD-10-CM | POA: Diagnosis not present

## 2023-11-22 DIAGNOSIS — M9905 Segmental and somatic dysfunction of pelvic region: Secondary | ICD-10-CM | POA: Diagnosis not present

## 2023-11-22 DIAGNOSIS — M25551 Pain in right hip: Secondary | ICD-10-CM | POA: Diagnosis not present

## 2023-11-22 DIAGNOSIS — M79651 Pain in right thigh: Secondary | ICD-10-CM | POA: Diagnosis not present

## 2023-11-22 DIAGNOSIS — M9906 Segmental and somatic dysfunction of lower extremity: Secondary | ICD-10-CM | POA: Diagnosis not present

## 2023-12-15 IMAGING — MG DIGITAL DIAGNOSTIC BILAT W/ TOMO W/ CAD
8 series · 8 of 24 positions shown · non-contrast
Comparison: This is the patient's baseline exam.

CLINICAL DATA: Patient describes a mass in the RIGHT axilla. She is
also concerned regarding a skin lesion in the MEDIAL aspect of the
RIGHT breast and rash over the LEFT breast. Patient's grandmother
was diagnosed with breast cancer.

EXAM:
DIGITAL DIAGNOSTIC BILATERAL MAMMOGRAM WITH TOMOSYNTHESIS AND CAD;
US AXILLARY RIGHT
TECHNIQUE: Bilateral digital diagnostic mammography and breast tomosynthesis
was performed. The images were evaluated with computer-aided
detection.; Targeted ultrasound examination of the right axilla was
performed.

[R MLO synth-2D]
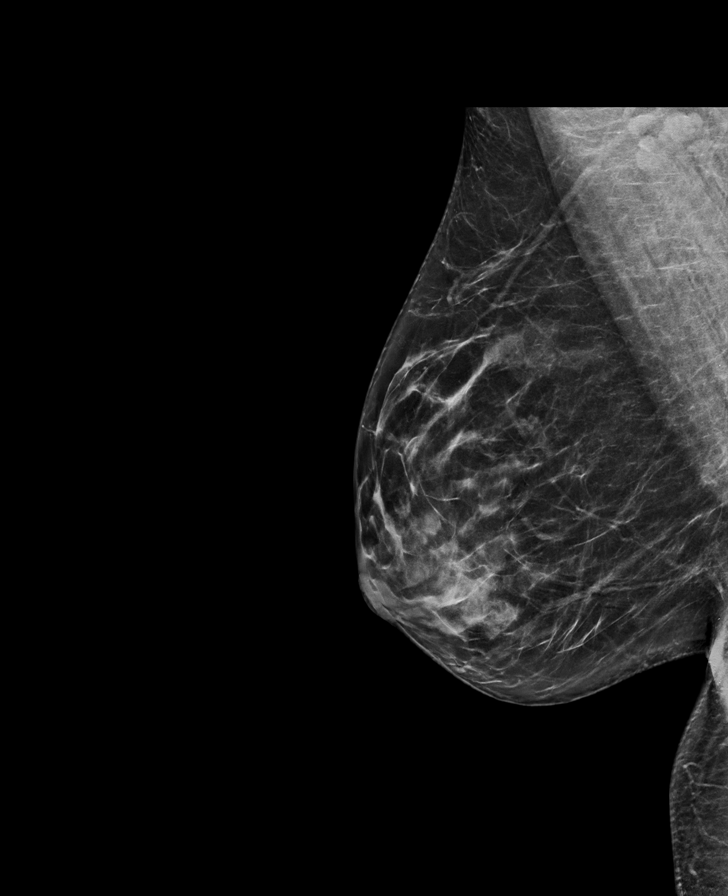

[R CC synth-2D]
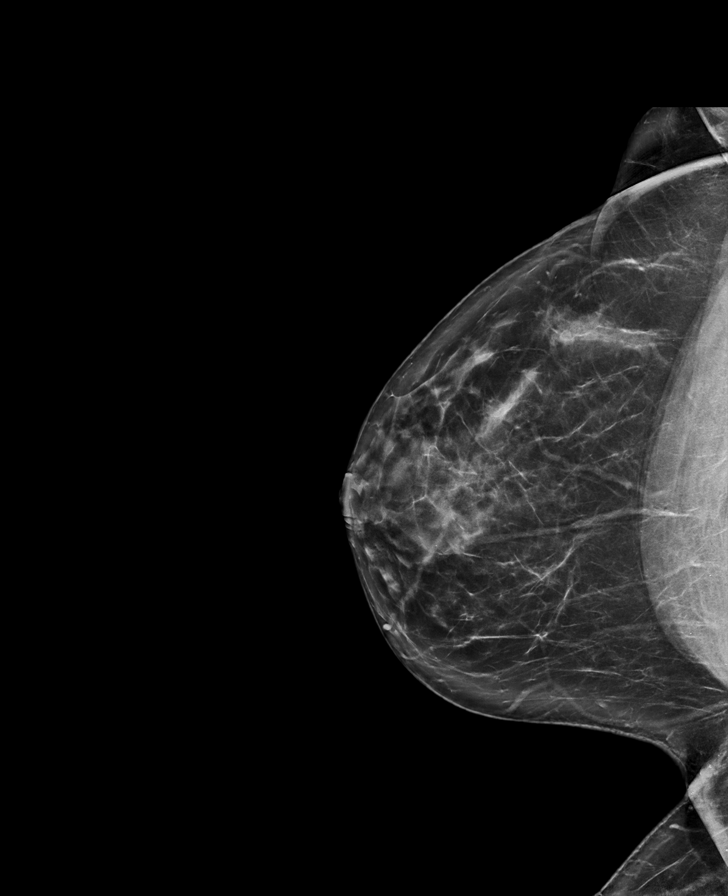

[L MLO synth-2D]
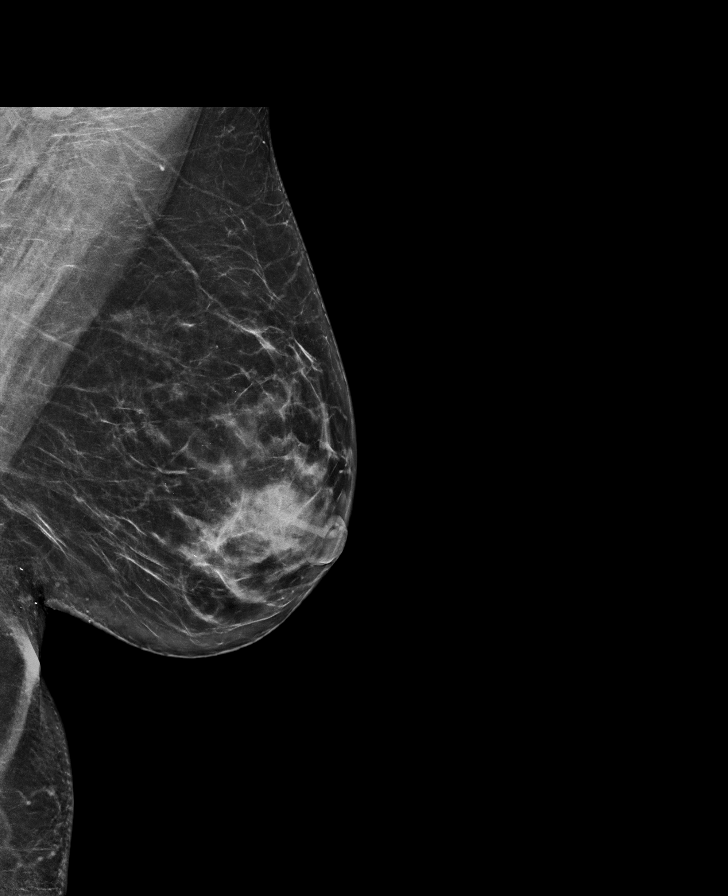

[L CC synth-2D]
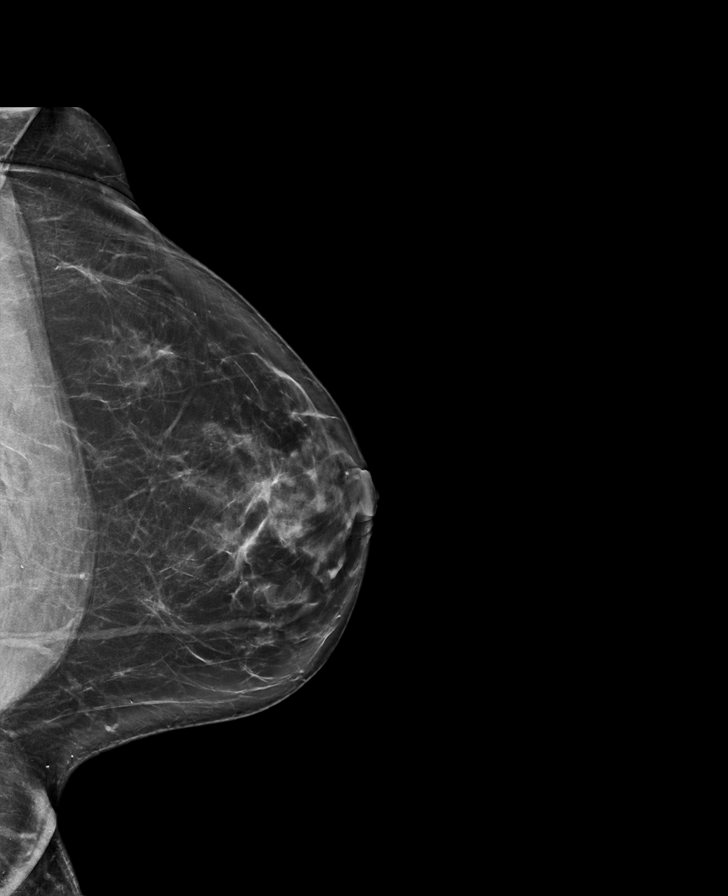

[R MLO tomo · tomo slice 37/72.0]
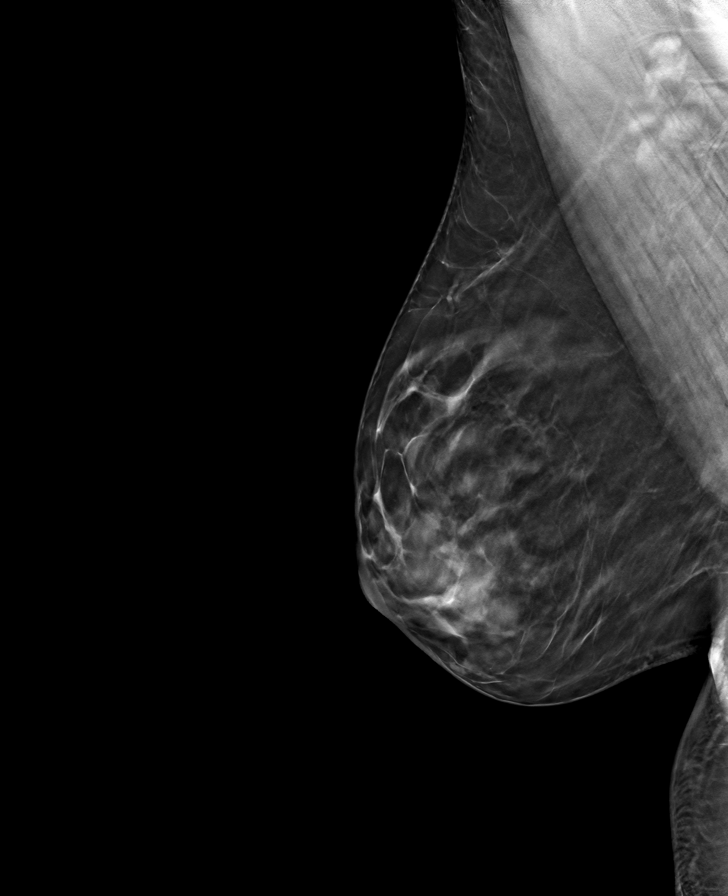

[R CC tomo · tomo slice 39/77.0]
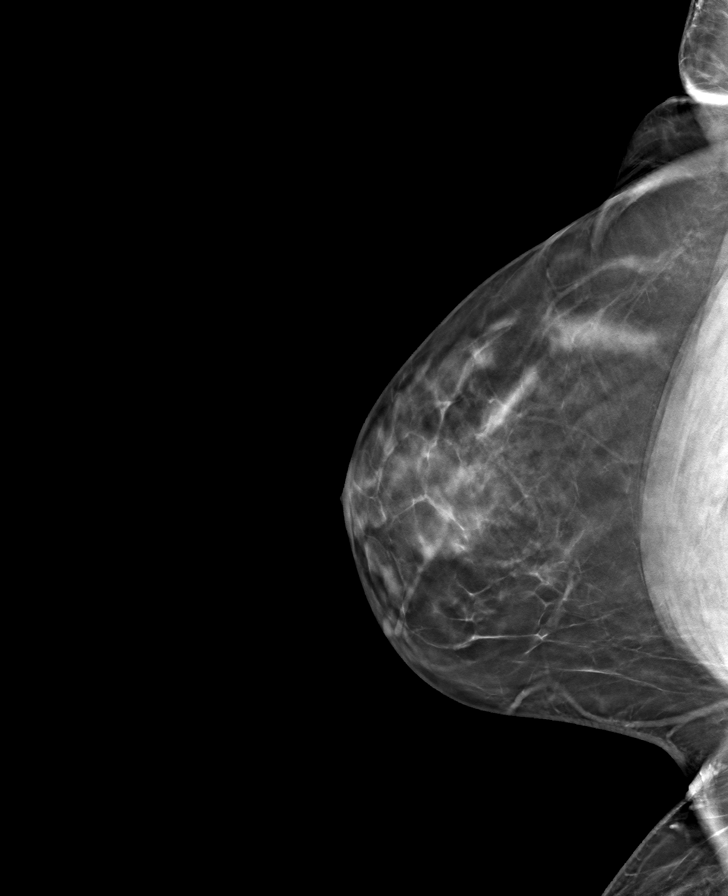

[L MLO tomo · tomo slice 37/72.0]
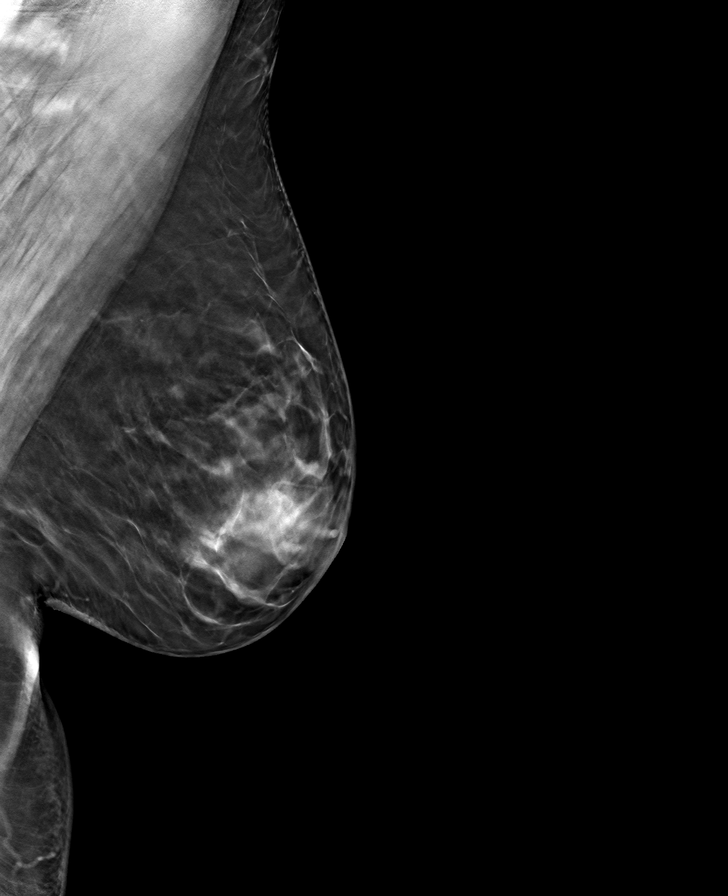

[L CC tomo · tomo slice 39/77.0]
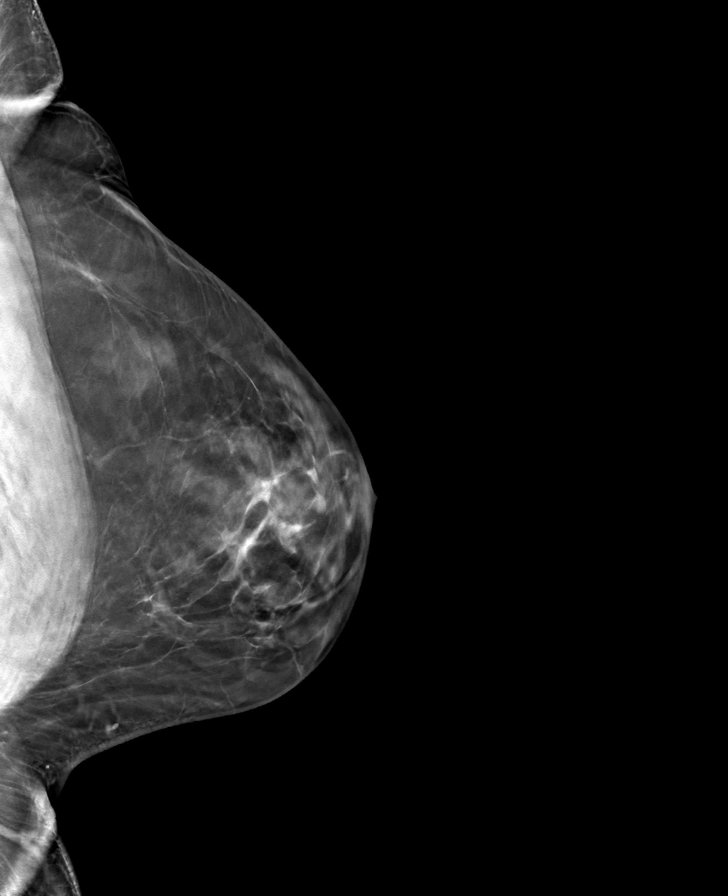

[8 of 24 positions shown; findings below may reference images not displayed]

ACR Breast Density Category c: The breast tissue is heterogeneously
dense, which may obscure small masses.
FINDINGS: RIGHT BREAST:

Mammogram: No suspicious mass, distortion, or microcalcifications
are identified to suggest presence of malignancy. Mammographic
images were processed with CAD.

Physical Exam: I palpate no discrete abnormality in the UPPER-OUTER
QUADRANT of the RIGHT breast or the RIGHT axilla. In the area of
patient's concern, I palpate LATERAL wall of the axilla, without
mass. Additionally, the patient is concerned regarding a 5
millimeter red papule on the RIGHT areola, 3 o'clock location. This
finding represents a benign [REDACTED] gland.

Ultrasound: Targeted ultrasound is performed, showing normal
appearing RIGHT axillary lymph nodes. No axillary mass identified.

LEFT BREAST:

Mammogram: No suspicious mass, distortion, or microcalcifications
are identified to suggest presence of malignancy. Mammographic
images were processed with CAD.

On physical exam, there is a faint pink rash along the 12 o'clock
location of the LEFT breast. The patient reports rash is
long-standing, over multiple years. She has had this assessed by a
dermatologist in the past.
IMPRESSION: No mammographic or ultrasound evidence for malignancy.

RECOMMENDATION:
Recommend screening mammogram at age 40 unless there are persistent
or intervening clinical concerns. (Code:0E-F-U6D)

I have discussed the findings and recommendations with the patient
and her wife. If applicable, a reminder letter will be sent to the
patient regarding the next appointment.

BI-RADS CATEGORY  1: Negative.

## 2023-12-20 DIAGNOSIS — G8929 Other chronic pain: Secondary | ICD-10-CM | POA: Diagnosis not present

## 2023-12-20 DIAGNOSIS — M25551 Pain in right hip: Secondary | ICD-10-CM | POA: Diagnosis not present

## 2023-12-20 DIAGNOSIS — M79651 Pain in right thigh: Secondary | ICD-10-CM | POA: Diagnosis not present

## 2023-12-20 DIAGNOSIS — M545 Low back pain, unspecified: Secondary | ICD-10-CM | POA: Diagnosis not present

## 2023-12-21 ENCOUNTER — Other Ambulatory Visit: Payer: Self-pay | Admitting: Nurse Practitioner

## 2023-12-21 DIAGNOSIS — Z6833 Body mass index (BMI) 33.0-33.9, adult: Secondary | ICD-10-CM | POA: Insufficient documentation

## 2023-12-21 MED ORDER — ZEPBOUND 2.5 MG/0.5ML ~~LOC~~ SOAJ: 2.5000 mg | SUBCUTANEOUS | 0 refills | Status: AC

## 2023-12-27 ENCOUNTER — Telehealth: Payer: Self-pay

## 2023-12-27 ENCOUNTER — Other Ambulatory Visit (HOSPITAL_COMMUNITY): Payer: Self-pay

## 2023-12-27 DIAGNOSIS — L309 Dermatitis, unspecified: Secondary | ICD-10-CM | POA: Diagnosis not present

## 2023-12-27 NOTE — Telephone Encounter (Signed)
 Copied from CRM [id number]. Topic: Clinical - Medication Prior Auth >> Dec 27, 2023  1:41 PM Wess RAMAN wrote: Reason for CRM: Pharmacy stated patient's insurance is not covering tirzepatide  (ZEPBOUND ) 2.5 MG/0.5ML Pen. They would like Early, Camie, NP to initiate a PA for medication  Pharmacy: [facility] [address] [facility] - [place], [place] - [address] [facility] [address] [place] [address] Phone: [phone] Fax: [phone] Hours: Not open 24 hours

## 2023-12-28 NOTE — Telephone Encounter (Signed)
 Please let Erica Stafford know that her insurance has denied zepbound . Per the report anti-obesity medications are a plan exclusion and not a covered benefit. This also means that other weight management medication options would not be covered.   Please let her know that both Zepbound  and Wegovy have cash pay options on their website that I am happy to send for her if this is an option that she would like to consider.   Alternatives that some patients have had benefit for weight management include bupropion (wellbutrin) metformin, and topiramate. The weight change varies by person and diet/exercise plan. I am happy to trial one of these if she is interested. They can usually get coverage.

## 2024-02-08 ENCOUNTER — Encounter: Payer: Self-pay | Admitting: Nurse Practitioner

## 2024-11-20 ENCOUNTER — Encounter: Admitting: Nurse Practitioner
